# Patient Record
Sex: Male | Born: 1940 | Race: White | Hispanic: No | Marital: Single | Smoking: Current every day smoker
Health system: Southern US, Community
[De-identification: ages and names within clinical notes are randomized; demographics above are authoritative.]

## PROBLEM LIST (undated history)

## (undated) DIAGNOSIS — M1990 Unspecified osteoarthritis, unspecified site: Secondary | ICD-10-CM

## (undated) DIAGNOSIS — R0602 Shortness of breath: Secondary | ICD-10-CM

## (undated) DIAGNOSIS — J449 Chronic obstructive pulmonary disease, unspecified: Secondary | ICD-10-CM

## (undated) DIAGNOSIS — I2510 Atherosclerotic heart disease of native coronary artery without angina pectoris: Secondary | ICD-10-CM

## (undated) DIAGNOSIS — I10 Essential (primary) hypertension: Secondary | ICD-10-CM

## (undated) DIAGNOSIS — R079 Chest pain, unspecified: Secondary | ICD-10-CM

## (undated) HISTORY — PX: MANDIBLE SURGERY: SHX707

---

## 2007-07-28 ENCOUNTER — Inpatient Hospital Stay (HOSPITAL_COMMUNITY): Admission: EM | Admit: 2007-07-28 | Discharge: 2007-08-06 | Payer: Self-pay | Admitting: Emergency Medicine

## 2007-07-30 ENCOUNTER — Encounter (INDEPENDENT_AMBULATORY_CARE_PROVIDER_SITE_OTHER): Payer: Self-pay | Admitting: Internal Medicine

## 2007-08-02 ENCOUNTER — Encounter (INDEPENDENT_AMBULATORY_CARE_PROVIDER_SITE_OTHER): Payer: Self-pay | Admitting: Internal Medicine

## 2007-08-02 ENCOUNTER — Ambulatory Visit: Payer: Self-pay | Admitting: Vascular Surgery

## 2007-08-13 ENCOUNTER — Ambulatory Visit: Payer: Self-pay | Admitting: Internal Medicine

## 2007-11-02 ENCOUNTER — Ambulatory Visit: Payer: Self-pay | Admitting: Cardiovascular Disease

## 2008-02-07 ENCOUNTER — Emergency Department: Payer: Self-pay | Admitting: Internal Medicine

## 2008-02-15 ENCOUNTER — Ambulatory Visit: Payer: Self-pay | Admitting: Urology

## 2008-02-21 ENCOUNTER — Ambulatory Visit: Payer: Self-pay | Admitting: Urology

## 2008-03-06 ENCOUNTER — Ambulatory Visit: Payer: Self-pay | Admitting: Urology

## 2008-03-10 ENCOUNTER — Ambulatory Visit: Payer: Self-pay | Admitting: Urology

## 2008-03-14 ENCOUNTER — Ambulatory Visit: Payer: Self-pay | Admitting: Urology

## 2008-12-25 ENCOUNTER — Inpatient Hospital Stay (HOSPITAL_COMMUNITY): Admission: EM | Admit: 2008-12-25 | Discharge: 2008-12-27 | Payer: Self-pay | Admitting: Emergency Medicine

## 2009-01-14 ENCOUNTER — Inpatient Hospital Stay (HOSPITAL_COMMUNITY): Admission: EM | Admit: 2009-01-14 | Discharge: 2009-01-16 | Payer: Self-pay | Admitting: Internal Medicine

## 2009-02-07 ENCOUNTER — Ambulatory Visit: Payer: Self-pay | Admitting: Internal Medicine

## 2009-02-14 ENCOUNTER — Ambulatory Visit: Payer: Self-pay | Admitting: Internal Medicine

## 2009-02-14 ENCOUNTER — Encounter: Payer: Self-pay | Admitting: Family Medicine

## 2009-02-14 LAB — CONVERTED CEMR LAB
ALT: 9 units/L (ref 0–53)
Albumin: 4.4 g/dL (ref 3.5–5.2)
BUN: 12 mg/dL (ref 6–23)
Basophils Relative: 0 % (ref 0–1)
CO2: 26 meq/L (ref 19–32)
Chloride: 103 meq/L (ref 96–112)
Creatinine, Ser: 0.77 mg/dL (ref 0.40–1.50)
Eosinophils Relative: 1 % (ref 0–5)
HCT: 50.8 % (ref 39.0–52.0)
Hemoglobin: 17.2 g/dL — ABNORMAL HIGH (ref 13.0–17.0)
LDL Cholesterol: 90 mg/dL (ref 0–99)
Lymphocytes Relative: 17 % (ref 12–46)
Lymphs Abs: 2.3 10*3/uL (ref 0.7–4.0)
MCHC: 33.9 g/dL (ref 30.0–36.0)
MCV: 89.8 fL (ref 78.0–100.0)
Monocytes Absolute: 1.1 10*3/uL — ABNORMAL HIGH (ref 0.1–1.0)
Monocytes Relative: 9 % (ref 3–12)
Neutro Abs: 9.7 10*3/uL — ABNORMAL HIGH (ref 1.7–7.7)
Platelets: 189 10*3/uL (ref 150–400)
Potassium: 3.9 meq/L (ref 3.5–5.3)
RDW: 14 % (ref 11.5–15.5)
Total CHOL/HDL Ratio: 3.4
Total Protein: 6.8 g/dL (ref 6.0–8.3)
Triglycerides: 80 mg/dL (ref <150)
VLDL: 16 mg/dL (ref 0–40)
WBC: 13.4 10*3/uL — ABNORMAL HIGH (ref 4.0–10.5)

## 2009-02-21 ENCOUNTER — Ambulatory Visit: Payer: Self-pay | Admitting: Internal Medicine

## 2009-02-27 ENCOUNTER — Ambulatory Visit: Payer: Self-pay | Admitting: Internal Medicine

## 2010-04-24 IMAGING — CR DG CHEST 1V PORT
1 series · 1 of 1 positions shown · non-contrast
Comparison: 12/25/2008

CLINICAL DATA: Shortness of breath

PORTABLE CHEST - 1 VIEW

[AP]
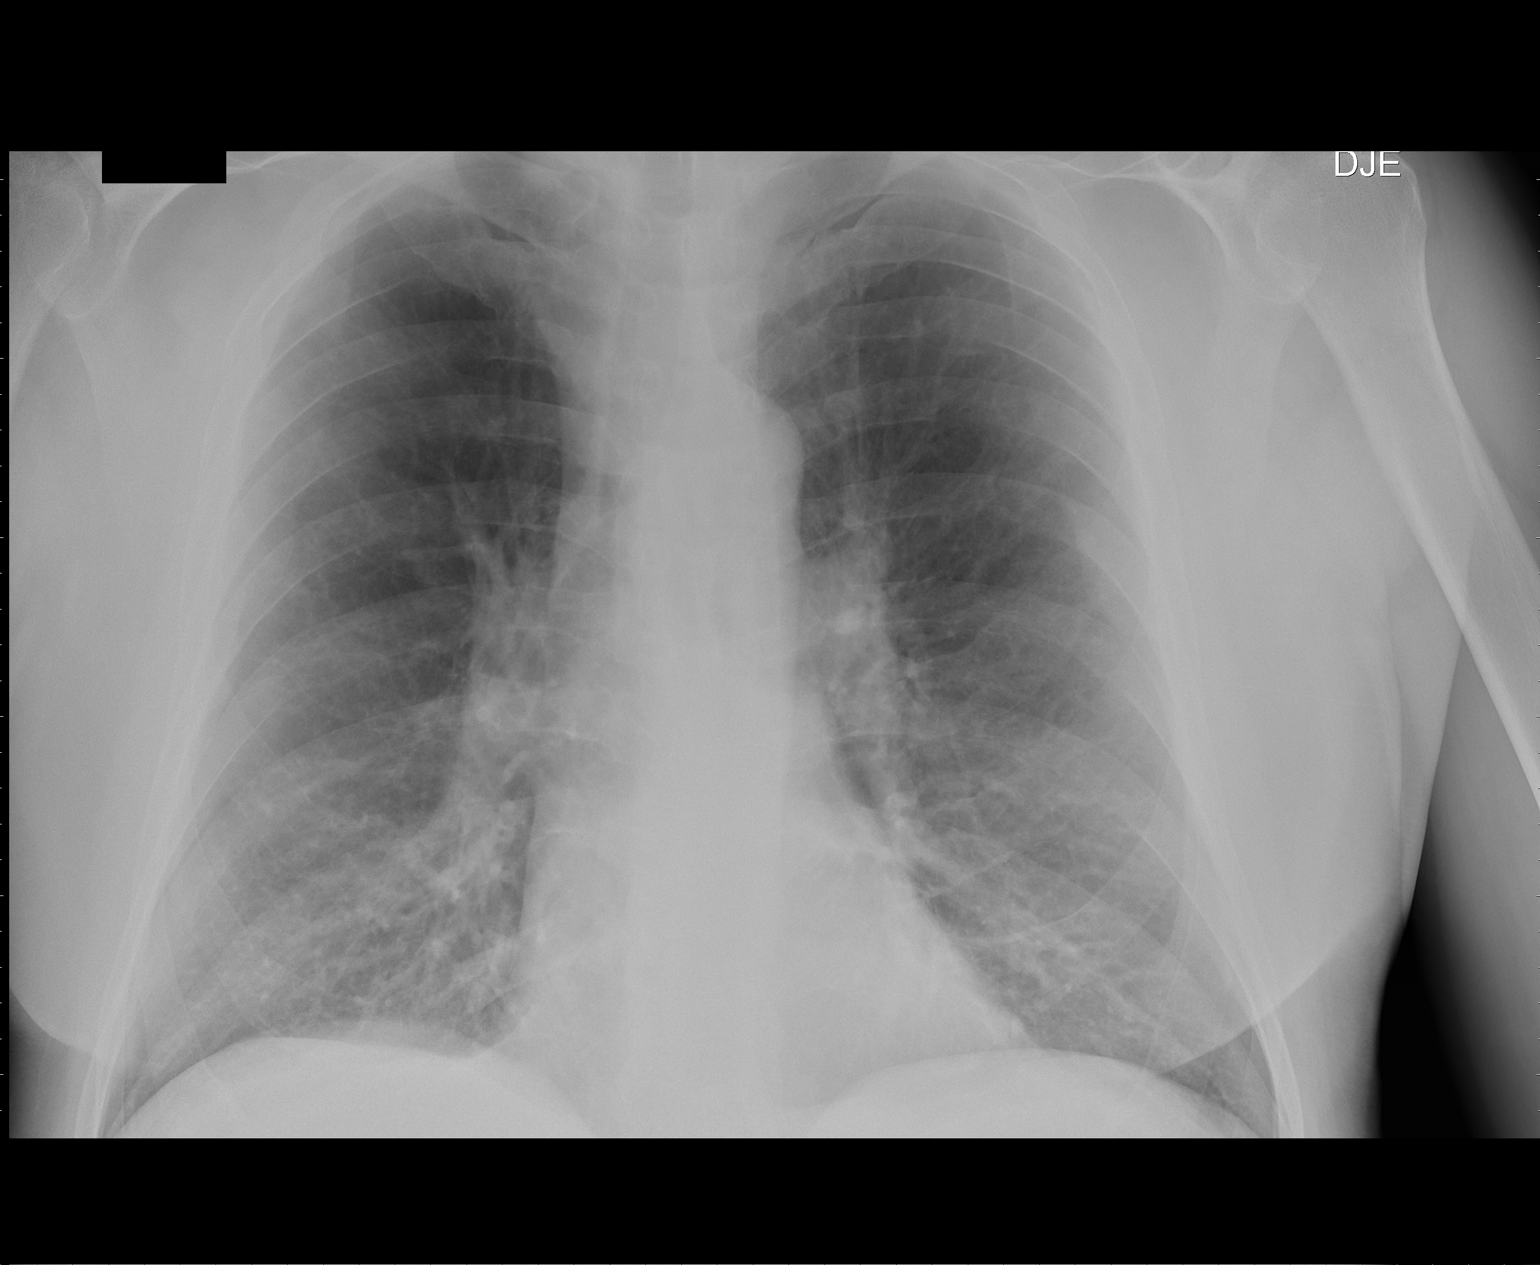

[1 of 1 positions shown; findings below may reference images not displayed]

FINDINGS: Heart size and mediastinal contours are unremarkable.

There is no pleural effusion or pulmonary interstitial edema.

No airspace consolidation identified.

There is interstitial change of COPD/emphysema.
IMPRESSION: 1.  No acute findings.
2.  COPD.

## 2010-07-18 LAB — GLUCOSE, CAPILLARY
Glucose-Capillary: 181 mg/dL — ABNORMAL HIGH (ref 70–99)
Glucose-Capillary: 198 mg/dL — ABNORMAL HIGH (ref 70–99)
Glucose-Capillary: 220 mg/dL — ABNORMAL HIGH (ref 70–99)
Glucose-Capillary: 268 mg/dL — ABNORMAL HIGH (ref 70–99)

## 2010-07-18 LAB — DIFFERENTIAL
Basophils Absolute: 0 10*3/uL (ref 0.0–0.1)
Basophils Relative: 0 % (ref 0–1)
Eosinophils Absolute: 0.3 10*3/uL (ref 0.0–0.7)
Lymphs Abs: 2.4 10*3/uL (ref 0.7–4.0)
Monocytes Absolute: 1 10*3/uL (ref 0.1–1.0)
Monocytes Relative: 8 % (ref 3–12)
Neutro Abs: 8.7 10*3/uL — ABNORMAL HIGH (ref 1.7–7.7)
Neutrophils Relative %: 70 % (ref 43–77)

## 2010-07-18 LAB — CBC
HCT: 45.5 % (ref 39.0–52.0)
HCT: 47.5 % (ref 39.0–52.0)
Hemoglobin: 14.9 g/dL (ref 13.0–17.0)
Hemoglobin: 15.7 g/dL (ref 13.0–17.0)
MCHC: 32.8 g/dL (ref 30.0–36.0)
MCHC: 33 g/dL (ref 30.0–36.0)
MCV: 90.1 fL (ref 78.0–100.0)
MCV: 90.9 fL (ref 78.0–100.0)
Platelets: 141 10*3/uL — ABNORMAL LOW (ref 150–400)
RBC: 4.98 MIL/uL (ref 4.22–5.81)
RBC: 5.23 MIL/uL (ref 4.22–5.81)
RDW: 14.4 % (ref 11.5–15.5)
WBC: 12.4 10*3/uL — ABNORMAL HIGH (ref 4.0–10.5)
WBC: 17.4 10*3/uL — ABNORMAL HIGH (ref 4.0–10.5)

## 2010-07-18 LAB — HEMOGLOBIN A1C: Mean Plasma Glucose: 140 mg/dL

## 2010-07-18 LAB — BASIC METABOLIC PANEL
Calcium: 9.3 mg/dL (ref 8.4–10.5)
Calcium: 9.6 mg/dL (ref 8.4–10.5)
Chloride: 107 mEq/L (ref 96–112)
Creatinine, Ser: 0.86 mg/dL (ref 0.4–1.5)
GFR calc non Af Amer: >60 mL/min (ref >60)
GFR calc non Af Amer: >60 mL/min (ref >60)
Glucose, Bld: 134 mg/dL — ABNORMAL HIGH (ref 70–99)
Glucose, Bld: 188 mg/dL — ABNORMAL HIGH (ref 70–99)
Potassium: 4.4 mEq/L (ref 3.5–5.1)

## 2010-07-18 LAB — POCT I-STAT 3, ART BLOOD GAS (G3+)
Acid-Base Excess: 1 mmol/L (ref 0.0–2.0)
Acid-Base Excess: 1 mmol/L (ref 0.0–2.0)
O2 Saturation: 89 %
TCO2: 27 mmol/L (ref 0–100)
TCO2: 28 mmol/L (ref 0–100)
pCO2 arterial: 41.9 mmHg (ref 35.0–45.0)

## 2010-07-18 LAB — COMPREHENSIVE METABOLIC PANEL
ALT: 10 U/L (ref 0–53)
CO2: 30 mEq/L (ref 19–32)
Calcium: 8.8 mg/dL (ref 8.4–10.5)
GFR calc non Af Amer: >60 mL/min (ref >60)
Sodium: 140 mEq/L (ref 135–145)

## 2010-07-19 LAB — CK TOTAL AND CKMB (NOT AT ARMC)
Relative Index: INVALID (ref 0.0–2.5)
Total CK: 52 U/L (ref 7–232)

## 2010-07-19 LAB — URINALYSIS, ROUTINE W REFLEX MICROSCOPIC
Bilirubin Urine: NEGATIVE
Glucose, UA: NEGATIVE mg/dL
Ketones, ur: NEGATIVE mg/dL
Protein, ur: NEGATIVE mg/dL

## 2010-07-19 LAB — CARDIAC PANEL(CRET KIN+CKTOT+MB+TROPI)
Total CK: 27 U/L (ref 7–232)
Total CK: 56 U/L (ref 7–232)

## 2010-07-19 LAB — GLUCOSE, CAPILLARY
Glucose-Capillary: 103 mg/dL — ABNORMAL HIGH (ref 70–99)
Glucose-Capillary: 145 mg/dL — ABNORMAL HIGH (ref 70–99)
Glucose-Capillary: 156 mg/dL — ABNORMAL HIGH (ref 70–99)
Glucose-Capillary: 182 mg/dL — ABNORMAL HIGH (ref 70–99)
Glucose-Capillary: 206 mg/dL — ABNORMAL HIGH (ref 70–99)

## 2010-07-19 LAB — POCT CARDIAC MARKERS
Myoglobin, poc: 68.4 ng/mL (ref 12–200)
Myoglobin, poc: 82.3 ng/mL (ref 12–200)
Troponin i, poc: <0.05 ng/mL (ref 0.00–0.09)

## 2010-07-19 LAB — HEMOGLOBIN A1C: Mean Plasma Glucose: 154 mg/dL

## 2010-07-19 LAB — DIFFERENTIAL
Basophils Absolute: 0 10*3/uL (ref 0.0–0.1)
Basophils Absolute: 0 10*3/uL (ref 0.0–0.1)
Basophils Relative: 0 % (ref 0–1)
Eosinophils Absolute: 0 10*3/uL (ref 0.0–0.7)
Eosinophils Relative: 0 % (ref 0–5)
Eosinophils Relative: 2 % (ref 0–5)
Lymphocytes Relative: 13 % (ref 12–46)
Lymphs Abs: 2.7 10*3/uL (ref 0.7–4.0)
Monocytes Absolute: 0.6 10*3/uL (ref 0.1–1.0)
Monocytes Absolute: 1.1 10*3/uL — ABNORMAL HIGH (ref 0.1–1.0)

## 2010-07-19 LAB — RAPID URINE DRUG SCREEN, HOSP PERFORMED
Amphetamines: NOT DETECTED
Benzodiazepines: NOT DETECTED
Tetrahydrocannabinol: NOT DETECTED

## 2010-07-19 LAB — COMPREHENSIVE METABOLIC PANEL
AST: 19 U/L (ref 0–37)
Albumin: 4 g/dL (ref 3.5–5.2)
Alkaline Phosphatase: 74 U/L (ref 39–117)
BUN: 13 mg/dL (ref 6–23)
Chloride: 106 mEq/L (ref 96–112)
GFR calc Af Amer: >60 mL/min (ref >60)
Potassium: 3.9 mEq/L (ref 3.5–5.1)
Total Bilirubin: 1.1 mg/dL (ref 0.3–1.2)

## 2010-07-19 LAB — CBC
HCT: 47.3 % (ref 39.0–52.0)
HCT: 51.1 % (ref 39.0–52.0)
Hemoglobin: 15.6 g/dL (ref 13.0–17.0)
MCV: 91.3 fL (ref 78.0–100.0)
Platelets: 156 10*3/uL (ref 150–400)
RBC: 5.71 MIL/uL (ref 4.22–5.81)
RDW: 14.4 % (ref 11.5–15.5)
WBC: 8.7 10*3/uL (ref 4.0–10.5)

## 2010-07-19 LAB — POCT I-STAT 3, ART BLOOD GAS (G3+)
O2 Saturation: 98 %
Patient temperature: 98.6
TCO2: 27 mmol/L (ref 0–100)

## 2010-07-19 LAB — POCT I-STAT, CHEM 8
Creatinine, Ser: 0.7 mg/dL (ref 0.4–1.5)
Glucose, Bld: 67 mg/dL — ABNORMAL LOW (ref 70–99)
Hemoglobin: 18.7 g/dL — ABNORMAL HIGH (ref 13.0–17.0)
TCO2: 26 mmol/L (ref 0–100)

## 2010-07-19 LAB — BASIC METABOLIC PANEL
BUN: 21 mg/dL (ref 6–23)
Chloride: 106 mEq/L (ref 96–112)
GFR calc non Af Amer: >60 mL/min (ref >60)
Glucose, Bld: 120 mg/dL — ABNORMAL HIGH (ref 70–99)
Potassium: 3.9 mEq/L (ref 3.5–5.1)
Sodium: 139 mEq/L (ref 135–145)

## 2010-07-19 LAB — LIPID PANEL
Cholesterol: 149 mg/dL (ref 0–200)
HDL: 54 mg/dL (ref >39)
LDL Cholesterol: 84 mg/dL (ref 0–99)
Triglycerides: 54 mg/dL (ref <150)

## 2010-07-19 LAB — BRAIN NATRIURETIC PEPTIDE: Pro B Natriuretic peptide (BNP): 39 pg/mL (ref 0.0–100.0)

## 2010-07-19 LAB — TSH: TSH: 0.723 u[IU]/mL (ref 0.350–4.500)

## 2010-08-27 NOTE — Discharge Summary (Signed)
NAME:  Jeremiah Burnett, Jeremiah Burnett NO.:  [id number]   MEDICAL RECORD NO.:  [id number]          PATIENT TYPE:  INP   LOCATION:  1411                         FACILITY[facility]  [facility]   PHYSICIAN:  Eduard Clos, MDDATE OF BIRTH:  Nov 09, 1940   DATE OF ADMISSION:  07/28/2007  DATE OF DISCHARGE:  08/05/2007                               DISCHARGE SUMMARY   ADDENDUM TO DISCHARGE SUMMARY:  Please see discharge summary dictated by  Dr. Sharon Seller for the hospital course and procedures done during this stay  on August 05, 2007.  This discharge summary is dictated for patient's  discharge medications as patient's condition has improved and is  asymptomatic and is being transferred to assisted living facility.   DISCHARGE MEDICATIONS:  1. Prednisone 30 mg daily. To be tapered by 10 mg daily for three      days.  2. Metformin 850 mg p.o. daily.  3. Albuterol MDI two puffs q.4 h p.r.n.  4. Atrovent MDI two puffs q.i.d. as needed.  5. Doxycycline 100 twice a day for one week.  6. Singulair 10 mg daily.  7. Lisinopril 10 mg p.o. daily.  8. Aspirin 81 mg daily.  9. Advair Diskus 250/50 one puff b.i.d.   PLAN:  The patient is being transferred to assisted living.  The patient  is to follow with the healthcare doctor in one week's time, actually on  May 1 to the __________.  The patient is to be on cardiac healthy, carb  modified diet, to check his blood sugars in the morning and evening.  Be  cautious about hypoglycemia, to recheck his basic metabolic panel and he  follows with his primary care physician in a week's time.  The patient  is to have a repeat CT chest in three months, which I did discuss with  the patient if the patient agreed to do so for further evaluation of his  scar in the right upper lung.  He was strongly advised to quit smoking.      Eduard Clos, MD  Electronically Signed     ANK/MEDQ  D:  08/05/2007  T:  08/05/2007  Job:  [id number]
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
## 2010-08-27 NOTE — Discharge Summary (Signed)
NAMEGUHAN, Jeremiah Burnett NO.:  [id number]   MEDICAL RECORD NO.:  [id number]          PATIENT TYPE:  INP   LOCATION:  1411                         FACILITY:  [facility]   PHYSICIAN:  Lonia Blood, M.D.DATE OF BIRTH:  [birth year]   DATE OF ADMISSION:  07/28/2007  DATE OF DISCHARGE:                               DISCHARGE SUMMARY   PRIMARY CARE PHYSICIAN:  [facility]   DISCHARGE DIAGNOSES:  1. Severe bronchiolitis - complete antibiotic course.  2. Acute chronic obstructive pulmonary disease exacerbation with      monitored severe baseline COPD.      a.     MDI therapy initiated.      b.     Singulair therapy initiated.      c.     Able to be weaned from O2 this hospitalization.  3. Right upper lobe lung scar - recommended CT scan follow up in three      months.  4. Newly diagnosed diabetes mellitus.      a.     Worsened by steroid taper.      b.     Medication therapy initiated during her hospital stay.      c.     Aspirin and ACE inhibitor therapy initiated during hospital       stay.  5. Bilateral lower extremity paresthesias.      a.     Normal B12 and folic acid.      b.     Normal ABIs.      c.     Felt to be diabetic neuropathy.  6. Tobacco abuse - discontinued during the hospital stay.   DISCHARGE MEDICATIONS:  Exact regimen to be determined at the time of  discharge.   FOLLOW UP:  The patient has been set up to follow up at [facility] on May 1, Friday at 11:15 in the morning with Dr. Donia Guiles.  At that time, the patient should be evaluated to assure that  his COPD is stable.  A three month follow up CT scan of the chest should  also be arranged to reevaluate the patient's right upper lobe scar.  Otherwise, the patient's b.i.d. CBG log should be assessed and the need  for further adjustment of his medication regimen should be considered.   CONSULTATIONS:  None.   PROCEDURES:  A CT scan of the chest on July 29, 2007:  Plain  film x-ray  suggestion of nodularity at the second right costovertebral junction  actually corresponds to degenerative change.  Central lobular emphysema  with ill-defined nodular opacities throughout.  Consistent with  infectious bronchiolitis.  Probable scarring in the right upper lobe  adjacent to pleural thickening.  Recommend a follow up CT scan of the  chest in three months.  Nonobstructive bilateral renal calculi.   HOSPITAL COURSE:  [name] is a very pleasant 70 year old male  who was admitted to the hospital on July 28, 2007, with complaints of  severe shortness of breath.  He also reported significant purulent  sputum for approximately a week.  The patient  was admitted to the acute  units.  Exam and x-ray suggested severe bronchiolitis with an acute  bronchospastic COPD exacerbation.  The patient required high-dose IV  Solu-Medrol as well as frequent nebulizer therapies.  Antibiotics were  administered.  Supplemental oxygen was required.  Fortunately, the  patient responded nicely to our therapy.  At the time of his discharge,  he is able to be weaned down to a minimal dose of steroids.  He is well  maintained on MDI therapy alone.  He is not requiring ongoing oxygen  therapy.  As noted above, a CT scan of the chest did reveal a right  upper lobe scar and recommendation is made to follow this up in three  months.  The patient was able to be liberated from oxygen and remained  stable from a respiratory standpoint at his discharge.  He was counseled  as to the connection between his acute illness and tobacco abuse and  advised to discontinue tobacco abuse.  He was able to successfully avoid  cravings of smoking during his hospital stay, and this was further  supported with a nicotine patch.   During the patient's hospital stay, significant hyperglycemia was noted.  Hemoglobin A1c was obtained and, in fact, was found to be elevated to  6.4.  For this reason, it was  felt that this was an actual diagnosis of  diabetes and not simply related to the patient's Solu-Medrol.  The  diabetic education and initiation of oral diabetes medicines was carried  out during his hospital stay.  Close monitoring of CBG in the outpatient  setting will be indicated and further adjustment of diabetic medications  will likely be necessary.  At the time of his discharge, however, his  CBGs do remain quite stable.   The patient also complained of bilateral lower extremity paresthesias  during his hospital stay.  Dorsalis pedis pulses were very weak on  manual palpation.  ABIs were accomplished and were, in fact, found to be  normal.  B12 and folic acid levels were obtained and were, in fact,  normal.  It is felt that this likely represents a mild early diabetic  neuropathy.  With good control of the patient's blood sugars, these  symptoms improved.  This shall simply need to be followed in the  outpatient setting.      Lonia Blood, M.D.  Electronically Signed     JTM/MEDQ  D:  08/04/2007  T:  08/04/2007  Job:  [id number]   cc:   Melvern Banker  Fax: [phone]

## 2011-01-07 LAB — CULTURE, RESPIRATORY W GRAM STAIN: Culture: NORMAL

## 2011-01-07 LAB — APTT
aPTT: 25
aPTT: 34

## 2011-01-07 LAB — DIFFERENTIAL
Basophils Absolute: 0
Basophils Relative: 0
Eosinophils Absolute: 0
Eosinophils Relative: 0
Lymphocytes Relative: 9 — ABNORMAL LOW
Lymphs Abs: 1.5
Monocytes Absolute: 1.4 — ABNORMAL HIGH
Monocytes Relative: 9
Neutro Abs: 13.4 — ABNORMAL HIGH
Neutrophils Relative %: 82 — ABNORMAL HIGH

## 2011-01-07 LAB — B-NATRIURETIC PEPTIDE (CONVERTED LAB): Pro B Natriuretic peptide (BNP): <30

## 2011-01-07 LAB — BASIC METABOLIC PANEL WITH GFR
BUN: 21
CO2: 25
Calcium: 9.3
Chloride: 102
Creatinine, Ser: 1
GFR calc non Af Amer: >60
Glucose, Bld: 341 — ABNORMAL HIGH
Potassium: 3.6
Sodium: 136

## 2011-01-07 LAB — CBC
HCT: 43.9
HCT: 49.4
Hemoglobin: 14.9
Hemoglobin: 16.7
MCHC: 33.9
MCV: 88.8
Platelets: 195
RBC: 4.94
RBC: 5.38
RBC: 5.58
RDW: 14.3
WBC: 16.4 — ABNORMAL HIGH
WBC: 20.2 — ABNORMAL HIGH
WBC: 23 — ABNORMAL HIGH

## 2011-01-07 LAB — BASIC METABOLIC PANEL
Calcium: 9.1
Chloride: 103
Creatinine, Ser: 0.86
GFR calc Af Amer: >60
GFR calc non Af Amer: >60
Potassium: 3.6
Sodium: 139

## 2011-01-07 LAB — VITAMIN B12: Vitamin B-12: 466 (ref 211–911)

## 2011-01-07 LAB — MAGNESIUM: Magnesium: 1.9

## 2011-01-07 LAB — EXPECTORATED SPUTUM ASSESSMENT W GRAM STAIN, RFLX TO RESP C

## 2011-01-07 LAB — HEMOGLOBIN A1C
Hgb A1c MFr Bld: 6.4 — ABNORMAL HIGH
Mean Plasma Glucose: 151

## 2011-01-07 LAB — FOLATE: Folate: 8.6

## 2011-01-07 LAB — CK TOTAL AND CKMB (NOT AT ARMC)
CK, MB: 0.9
Relative Index: INVALID
Total CK: 31

## 2011-01-07 LAB — TROPONIN I: Troponin I: 0.03

## 2011-01-07 LAB — TSH: TSH: 0.433

## 2011-01-07 LAB — PHOSPHORUS: Phosphorus: 2.5

## 2011-08-31 ENCOUNTER — Inpatient Hospital Stay (HOSPITAL_COMMUNITY)
Admission: EM | Admit: 2011-08-31 | Discharge: 2011-09-08 | DRG: 286 | Disposition: A | Discharge status: Home / Self Care | Payer: Medicare Other | Source: Ambulatory Visit | Attending: Cardiology | Admitting: Cardiology

## 2011-08-31 ENCOUNTER — Encounter (HOSPITAL_COMMUNITY): Payer: Self-pay | Admitting: *Deleted

## 2011-08-31 ENCOUNTER — Emergency Department (HOSPITAL_COMMUNITY): Payer: Medicare Other

## 2011-08-31 DIAGNOSIS — E1169 Type 2 diabetes mellitus with other specified complication: Secondary | ICD-10-CM | POA: Diagnosis present

## 2011-08-31 DIAGNOSIS — R0600 Dyspnea, unspecified: Secondary | ICD-10-CM

## 2011-08-31 DIAGNOSIS — J9600 Acute respiratory failure, unspecified whether with hypoxia or hypercapnia: Secondary | ICD-10-CM | POA: Diagnosis present

## 2011-08-31 DIAGNOSIS — I2510 Atherosclerotic heart disease of native coronary artery without angina pectoris: Principal | ICD-10-CM | POA: Diagnosis present

## 2011-08-31 DIAGNOSIS — I249 Acute ischemic heart disease, unspecified: Secondary | ICD-10-CM

## 2011-08-31 DIAGNOSIS — E7800 Pure hypercholesterolemia, unspecified: Secondary | ICD-10-CM | POA: Diagnosis present

## 2011-08-31 DIAGNOSIS — Z87891 Personal history of nicotine dependence: Secondary | ICD-10-CM

## 2011-08-31 DIAGNOSIS — I10 Essential (primary) hypertension: Secondary | ICD-10-CM | POA: Diagnosis present

## 2011-08-31 DIAGNOSIS — K219 Gastro-esophageal reflux disease without esophagitis: Secondary | ICD-10-CM | POA: Diagnosis present

## 2011-08-31 DIAGNOSIS — J441 Chronic obstructive pulmonary disease with (acute) exacerbation: Secondary | ICD-10-CM | POA: Diagnosis present

## 2011-08-31 HISTORY — DX: Essential (primary) hypertension: I10

## 2011-08-31 HISTORY — DX: Chronic obstructive pulmonary disease, unspecified: J44.9

## 2011-08-31 LAB — COMPREHENSIVE METABOLIC PANEL
ALT: 11 U/L (ref 0–53)
ALT: 11 U/L (ref 0–53)
AST: 11 U/L (ref 0–37)
AST: 12 U/L (ref 0–37)
Alkaline Phosphatase: 76 U/L (ref 39–117)
Alkaline Phosphatase: 72 U/L (ref 39–117)
CO2: 27 mEq/L (ref 19–32)
Calcium: 9.3 mg/dL (ref 8.4–10.5)
GFR calc Af Amer: 83 mL/min — ABNORMAL LOW (ref >90)
Glucose, Bld: 119 mg/dL — ABNORMAL HIGH (ref 70–99)
Potassium: 3.4 mEq/L — ABNORMAL LOW (ref 3.5–5.1)
Potassium: 3.4 mEq/L — ABNORMAL LOW (ref 3.5–5.1)
Sodium: 139 mEq/L (ref 135–145)
Sodium: 141 mEq/L (ref 135–145)
Total Protein: 6.2 g/dL (ref 6.0–8.3)
Total Protein: 6.2 g/dL (ref 6.0–8.3)

## 2011-08-31 LAB — CBC
HCT: 46.1 % (ref 39.0–52.0)
Hemoglobin: 16.6 g/dL (ref 13.0–17.0)
Hemoglobin: 15.7 g/dL (ref 13.0–17.0)
MCHC: 35 g/dL (ref 30.0–36.0)
MCHC: 34.1 g/dL (ref 30.0–36.0)
Platelets: 142 10*3/uL — ABNORMAL LOW (ref 150–400)
RBC: 5.45 MIL/uL (ref 4.22–5.81)
WBC: 10 10*3/uL (ref 4.0–10.5)

## 2011-08-31 LAB — TROPONIN I: Troponin I: <0.3 ng/mL (ref <0.30)

## 2011-08-31 LAB — DIFFERENTIAL
Basophils Absolute: 0 10*3/uL (ref 0.0–0.1)
Basophils Relative: 0 % (ref 0–1)
Lymphocytes Relative: 24 % (ref 12–46)
Monocytes Relative: 8 % (ref 3–12)
Neutro Abs: 6.7 10*3/uL (ref 1.7–7.7)
Neutrophils Relative %: 66 % (ref 43–77)

## 2011-08-31 LAB — CARDIAC PANEL(CRET KIN+CKTOT+MB+TROPI)
CK, MB: 2.2 ng/mL (ref 0.3–4.0)
Relative Index: INVALID (ref 0.0–2.5)
Troponin I: <0.3 ng/mL (ref <0.30)

## 2011-08-31 LAB — GLUCOSE, CAPILLARY: Glucose-Capillary: 66 mg/dL — ABNORMAL LOW (ref 70–99)

## 2011-08-31 LAB — PROTIME-INR
INR: 1.04 (ref 0.00–1.49)
Prothrombin Time: 13.8 seconds (ref 11.6–15.2)

## 2011-08-31 MED ORDER — ONDANSETRON HCL 4 MG/2ML IJ SOLN: 4.0000 mg | Freq: Four times a day (QID) | INTRAMUSCULAR | Status: DC | PRN

## 2011-08-31 MED ORDER — GI COCKTAIL ~~LOC~~
30.0000 mL | Freq: Once | ORAL | Status: AC
  Administered 2011-08-31: 30 mL via ORAL
  Filled 2011-08-31: qty 30

## 2011-08-31 MED ORDER — HYDROMORPHONE HCL PF 1 MG/ML IJ SOLN
1.0000 mg | Freq: Once | INTRAMUSCULAR | Status: AC
  Administered 2011-08-31: 1 mg via INTRAVENOUS
  Filled 2011-08-31: qty 1

## 2011-08-31 MED ORDER — ASPIRIN EC 81 MG PO TBEC
81.0000 mg | DELAYED_RELEASE_TABLET | Freq: Every day | ORAL | Status: DC
  Administered 2011-09-01 – 2011-09-08 (×8): 81 mg via ORAL
  Filled 2011-08-31 (×8): qty 1

## 2011-08-31 MED ORDER — ASPIRIN EC 81 MG PO TBEC: 81.0000 mg | DELAYED_RELEASE_TABLET | Freq: Every day | ORAL | Status: DC

## 2011-08-31 MED ORDER — NITROGLYCERIN 2 % TD OINT: 1.0000 [in_us] | TOPICAL_OINTMENT | Freq: Once | TRANSDERMAL | Status: DC

## 2011-08-31 MED ORDER — METOPROLOL TARTRATE 12.5 MG HALF TABLET
12.5000 mg | ORAL_TABLET | Freq: Two times a day (BID) | ORAL | Status: DC
  Administered 2011-08-31 – 2011-09-04 (×8): 12.5 mg via ORAL
  Filled 2011-08-31 (×9): qty 1

## 2011-08-31 MED ORDER — SODIUM CHLORIDE 0.9 % IV BOLUS (SEPSIS)
500.0000 mL | Freq: Once | INTRAVENOUS | Status: AC
  Administered 2011-08-31: 500 mL via INTRAVENOUS

## 2011-08-31 MED ORDER — NITROGLYCERIN 2 % TD OINT
0.5000 [in_us] | TOPICAL_OINTMENT | Freq: Four times a day (QID) | TRANSDERMAL | Status: DC
  Administered 2011-08-31 – 2011-09-02 (×6): 0.5 [in_us] via TOPICAL
  Filled 2011-08-31: qty 30

## 2011-08-31 MED ORDER — MORPHINE SULFATE 2 MG/ML IJ SOLN
2.0000 mg | Freq: Once | INTRAMUSCULAR | Status: AC
  Administered 2011-08-31: 2 mg via INTRAVENOUS
  Filled 2011-08-31: qty 1

## 2011-08-31 MED ORDER — NITROGLYCERIN 0.4 MG SL SUBL: 0.4000 mg | SUBLINGUAL_TABLET | SUBLINGUAL | Status: DC | PRN

## 2011-08-31 MED ORDER — FAMOTIDINE 20 MG PO TABS
20.0000 mg | ORAL_TABLET | Freq: Two times a day (BID) | ORAL | Status: DC
  Administered 2011-08-31 – 2011-09-08 (×15): 20 mg via ORAL
  Filled 2011-08-31 (×19): qty 1

## 2011-08-31 MED ORDER — ATORVASTATIN CALCIUM 80 MG PO TABS
80.0000 mg | ORAL_TABLET | Freq: Every day | ORAL | Status: DC
  Administered 2011-09-01 – 2011-09-07 (×7): 80 mg via ORAL
  Filled 2011-08-31 (×10): qty 1

## 2011-08-31 MED ORDER — INSULIN ASPART 100 UNIT/ML ~~LOC~~ SOLN
0.0000 [IU] | Freq: Three times a day (TID) | SUBCUTANEOUS | Status: DC
  Administered 2011-09-02: 8 [IU] via SUBCUTANEOUS
  Administered 2011-09-03: 3 [IU] via SUBCUTANEOUS
  Administered 2011-09-03 – 2011-09-04 (×3): 5 [IU] via SUBCUTANEOUS
  Administered 2011-09-04: 8 [IU] via SUBCUTANEOUS
  Administered 2011-09-04: 5 [IU] via SUBCUTANEOUS
  Administered 2011-09-05 (×2): 3 [IU] via SUBCUTANEOUS
  Administered 2011-09-06: 2 [IU] via SUBCUTANEOUS
  Administered 2011-09-06: 3 [IU] via SUBCUTANEOUS
  Administered 2011-09-07: 8 [IU] via SUBCUTANEOUS
  Administered 2011-09-07: 3 [IU] via SUBCUTANEOUS
  Administered 2011-09-08: 2 [IU] via SUBCUTANEOUS

## 2011-08-31 MED ORDER — LEVALBUTEROL HCL 1.25 MG/0.5ML IN NEBU
1.2500 mg | INHALATION_SOLUTION | Freq: Three times a day (TID) | RESPIRATORY_TRACT | Status: DC
  Administered 2011-08-31 – 2011-09-08 (×23): 1.25 mg via RESPIRATORY_TRACT
  Filled 2011-08-31 (×28): qty 0.5

## 2011-08-31 MED ORDER — CLOPIDOGREL BISULFATE 75 MG PO TABS
300.0000 mg | ORAL_TABLET | Freq: Once | ORAL | Status: AC
  Administered 2011-08-31: 300 mg via ORAL
  Filled 2011-08-31: qty 1

## 2011-08-31 MED ORDER — CLOPIDOGREL BISULFATE 75 MG PO TABS
75.0000 mg | ORAL_TABLET | Freq: Every day | ORAL | Status: DC
Start: 2011-09-01 — End: 2011-09-08
  Administered 2011-09-01 – 2011-09-08 (×8): 75 mg via ORAL
  Filled 2011-08-31 (×8): qty 1

## 2011-08-31 MED ORDER — ASPIRIN 325 MG PO TABS: 325.0000 mg | ORAL_TABLET | Freq: Once | ORAL | Status: DC

## 2011-08-31 MED ORDER — TIOTROPIUM BROMIDE MONOHYDRATE 18 MCG IN CAPS
18.0000 ug | ORAL_CAPSULE | Freq: Every day | RESPIRATORY_TRACT | Status: DC
  Administered 2011-09-01 – 2011-09-08 (×7): 18 ug via RESPIRATORY_TRACT
  Filled 2011-08-31 (×3): qty 5

## 2011-08-31 MED ORDER — ALPRAZOLAM 0.25 MG PO TABS: 0.2500 mg | ORAL_TABLET | Freq: Two times a day (BID) | ORAL | Status: DC | PRN

## 2011-08-31 MED ORDER — ACETAMINOPHEN 325 MG PO TABS: 650.0000 mg | ORAL_TABLET | ORAL | Status: DC | PRN

## 2011-08-31 MED ORDER — LEVALBUTEROL HCL 1.25 MG/0.5ML IN NEBU
1.2500 mg | INHALATION_SOLUTION | RESPIRATORY_TRACT | Status: DC | PRN
  Administered 2011-09-01: 1.25 mg via RESPIRATORY_TRACT
  Filled 2011-08-31 (×2): qty 0.5

## 2011-08-31 MED ORDER — ASPIRIN 300 MG RE SUPP
300.0000 mg | RECTAL | Status: AC
  Filled 2011-08-31: qty 1

## 2011-08-31 MED ORDER — ASPIRIN 81 MG PO CHEW
324.0000 mg | CHEWABLE_TABLET | ORAL | Status: AC
  Filled 2011-08-31: qty 4
  Filled 2011-08-31: qty 1

## 2011-08-31 MED ORDER — HEPARIN BOLUS VIA INFUSION
4000.0000 [IU] | Freq: Once | INTRAVENOUS | Status: AC
Start: 2011-08-31 — End: 2011-08-31
  Administered 2011-08-31: 4000 [IU] via INTRAVENOUS
  Filled 2011-08-31: qty 4000

## 2011-08-31 MED ORDER — SODIUM CHLORIDE 0.9 % IV SOLN
INTRAVENOUS | Status: DC
  Administered 2011-08-31 – 2011-09-02 (×3): via INTRAVENOUS

## 2011-08-31 MED ORDER — FLUTICASONE-SALMETEROL 250-50 MCG/DOSE IN AEPB
1.0000 | INHALATION_SPRAY | Freq: Two times a day (BID) | RESPIRATORY_TRACT | Status: DC
  Administered 2011-08-31 – 2011-09-08 (×16): 1 via RESPIRATORY_TRACT
  Filled 2011-08-31 (×3): qty 14

## 2011-08-31 MED ORDER — HEPARIN (PORCINE) IN NACL 100-0.45 UNIT/ML-% IJ SOLN
1000.0000 [IU]/h | INTRAMUSCULAR | Status: DC
  Administered 2011-08-31: 1000 [IU]/h via INTRAVENOUS
  Filled 2011-08-31 (×2): qty 250

## 2011-08-31 NOTE — ED Provider Notes (Signed)
Complains of anterior chest pain/epigastric pain, nonradiating onset yesterday. Intermittent lasting approximately 45 minutes at a time initially worsened with exertion improved with rest accompanied by shortness of breath now has intermittent symptoms at rest. EMS treated patient with aspirin and 2 sublingual nitroglycerin without relief. On exam patient chronically ill-appearing lungs tachypnea clear to auscultation abdomen nontender heart regular rhythm no murmurs extremities without edema skin warm dry  medical decision making:Angina definite possibility given patient's risk factors i.e.age. Former smoker diabetes hypercholesterolemia  Doug Sou, MD 09/01/11 [phone]

## 2011-08-31 NOTE — H&P (Signed)
Jeremiah Burnett is an 71 y.o. male.   Chief Complaint: Chest pain/shortness of breath HPI: Patient is 71 year old male with past medical history significant for hypertension non-insulin-dependent diabetes mellitus, COPD, hypercholesteremia, history of tobacco abuse, history of alcohol abuse, positive family history of coronary artery disease, came to the ER complaining of recurrent retrosternal chest pain described as burning/heaviness radiating across the chest associated with shortness of breath and dizziness while [place] the highway [place] off and on since this morning patient received 4 baby aspirin to sublingual nitroglycerin with partial relief of chest pain in ER and was noted to be in sinus tachycardia with hypotension requiring fluid challenge and EKG in the ER showed sinus tach with no acute ischemic changes. Patient states he has been walking since yesterday from [facility] and developed exertional chest pain relieved initially with rest but again had recurrent chest pain at rest as he continued to work. Patient denies any nausea or vomiting diaphoresis denies palpitation lightheadedness or syncope but felt dizzy. Denies any relation of chest pain to food breathing or movement denies any cardiac workup in recent past.  Past Medical History  Diagnosis Date  . Diabetes mellitus   . Hypertension     History reviewed. No pertinent past surgical history.  No family history on file. Social History:  does not have a smoking history on file. He does not have any smokeless tobacco history on file. His alcohol and drug histories not on file.  Allergies: No Known Allergies   (Not in a hospital admission)  Results for orders placed during the hospital encounter of 08/31/11 (from the past 48 hour(s))  TROPONIN I     Status: Normal   Collection Time   08/31/11  3:20 PM      Component Value Range Comment   Troponin I <0.30  <0.30 (ng/mL)   CBC     Status: Abnormal   Collection Time   08/31/11  3:20 PM      Component Value Range Comment   WBC 12.2 (*) 4.0 - 10.5 (K/uL)    RBC 5.45  4.22 - 5.81 (MIL/uL)    Hemoglobin 16.6  13.0 - 17.0 (g/dL)    HCT 08.6  57.8 - 46.9 (%)    MCV 87.0  78.0 - 100.0 (fL)    MCH 30.5  26.0 - 34.0 (pg)    MCHC 35.0  30.0 - 36.0 (g/dL)    RDW 62.9  52.8 - 41.3 (%)    Platelets 142 (*) 150 - 400 (K/uL)   COMPREHENSIVE METABOLIC PANEL     Status: Abnormal   Collection Time   08/31/11  3:20 PM      Component Value Range Comment   Sodium 139  135 - 145 (mEq/L)    Potassium 3.4 (*) 3.5 - 5.1 (mEq/L)    Chloride 103  96 - 112 (mEq/L)    CO2 21  19 - 32 (mEq/L)    Glucose, Bld 119 (*) 70 - 99 (mg/dL)    BUN 15  6 - 23 (mg/dL)    Creatinine, Ser 2.44  0.50 - 1.35 (mg/dL)    Calcium 9.5  8.4 - 10.5 (mg/dL)    Total Protein 6.2  6.0 - 8.3 (g/dL)    Albumin 3.4 (*) 3.5 - 5.2 (g/dL)    AST 11  0 - 37 (U/L)    ALT 11  0 - 53 (U/L)    Alkaline Phosphatase 76  39 - 117 (U/L)    Total  Bilirubin 2.0 (*) 0.3 - 1.2 (mg/dL)    GFR calc non Af Amer 71 (*) >90 (mL/min)    GFR calc Af Amer 83 (*) >90 (mL/min)   LIPASE, BLOOD     Status: Normal   Collection Time   08/31/11  3:20 PM      Component Value Range Comment   Lipase 29  11 - 59 (U/L)    Dg Chest Portable 1 View  08/31/2011  *RADIOLOGY REPORT*  Clinical Data: Chest pain and shortness of breath.  PORTABLE CHEST - 1 VIEW  Comparison: Chest x-ray 01/14/2009.  Findings: Lung volumes are normal.  Linear bibasilar opacities are favored to represent areas of subsegmental atelectasis or scarring (similar to prior).  No consolidative airspace disease.  No pleural effusions.  Pulmonary vasculature and the cardiomediastinal silhouette are within normal limits.  IMPRESSION: 1.  Minimal bibasilar subsegmental atelectasis and/or scarring.  No radiographic evidence of acute cardiopulmonary disease.  Original Report Authenticated By: Florencia Reasons, M.D.    Review of Systems  Constitutional: Negative for  fever, chills and weight loss.  Eyes: Negative for blurred vision and double vision.  Respiratory: Positive for cough, shortness of breath and wheezing. Negative for hemoptysis and sputum production.   Cardiovascular: Positive for chest pain. Negative for palpitations, orthopnea, claudication and leg swelling.  Gastrointestinal: Negative for heartburn, nausea and abdominal pain.  Neurological: Positive for dizziness. Negative for headaches.    Blood pressure 111/64, pulse 73, temperature 98.7 F (37.1 C), temperature source Oral, resp. rate 19, SpO2 95.00%. Physical Exam  Constitutional: He is oriented to person, place, and time.  HENT:  Head: Normocephalic and atraumatic.  Nose: Nose normal.  Mouth/Throat: No oropharyngeal exudate.  Eyes: Conjunctivae and EOM are normal. Pupils are equal, round, and reactive to light. Left eye exhibits no discharge. No scleral icterus.  Neck: Normal range of motion. Neck supple. No JVD present. No tracheal deviation present. No thyromegaly present.  Cardiovascular: Normal rate and regular rhythm.  Exam reveals no friction rub.   Murmur heard.      Soft systolic murmur and S3 gallop noted  Respiratory:       Decrease breath sound at bases with bilateral expiratory wheezing  GI: Soft. He exhibits no distension. There is no tenderness. There is no rebound.  Musculoskeletal: He exhibits no edema and no tenderness.  Lymphadenopathy:    He has no cervical adenopathy.  Neurological: He is alert and oriented to person, place, and time.     Assessment/Plan Acute coronary syndrome rule out MI Exacerbation of COPD Hypertension Non-insulin-dependent diabetes mellitus History of tobacco abuse History of alcohol abuse Positive family history of coronary artery disease Plan As per orders  Dunbar Buras N 08/31/2011, 6:19 PM

## 2011-08-31 NOTE — Progress Notes (Signed)
ANTICOAGULATION CONSULT NOTE - Initial Consult  Pharmacy Consult for Heparin Indication: chest pain/ACS  No Known Allergies  Patient Measurements: Height: 5\' 8"  (172.7 cm) Weight: 178 lb 12.7 oz (81.1 kg) IBW/kg (Calculated) : 68.4  Heparin Dosing Weight: 81.1 kg  Vital Signs: Temp: 97.5 F (36.4 C) (05/19 2041) Temp src: Oral (05/19 1931) BP: 114/76 mmHg (05/19 2041) Pulse Rate: 70  (05/19 2041)  Labs:  Basename 08/31/11 1520  HGB 16.6  HCT 47.4  PLT 142*  APTT --  LABPROT --  INR --  HEPARINUNFRC --  CREATININE 1.02  CKTOTAL --  CKMB --  TROPONINI <0.30    Estimated Creatinine Clearance: 63.3 ml/min (by C-G formula based on Cr of 1.02).   Medical History: Past Medical History  Diagnosis Date  . Diabetes mellitus   . Hypertension     Medications:  Prescriptions prior to admission  Medication Sig Dispense Refill  . aspirin EC 81 MG tablet Take 81 mg by mouth daily.      Marland Kitchen glimepiride (AMARYL) 2 MG tablet Take 2 mg by mouth daily before breakfast.      . losartan (COZAAR) 100 MG tablet Take 100 mg by mouth daily.        Assessment: 71yom to start heparin for CP/ACS. Patient is not taking anticoagulants and no significant bleeding has been reported. - Baseline INR pending - H/H wnl, Plts 142 - Heparin dosing weight: 81.1kg - CrCl 53ml/min  Goal of Therapy:  Heparin level 0.3-0.7 units/ml Monitor platelets by anticoagulation protocol: Yes   Plan:  1. Heparin IV bolus 4000 units x 1 2. Heparin drip 1000 units/hr (10 ml/hr) 3. Check heparin level 8hrs after heparin initiation 4. Daily heparin level and CBC  Cleon Dew [phone] 08/31/2011,8:44 PM

## 2011-08-31 NOTE — ED Provider Notes (Signed)
History     CSN: [id number]  Arrival date & time 08/31/11  1508   First MD Initiated Contact with Patient 08/31/11 1516      Chief Complaint  Patient presents with  . Chest Pain    (Consider location/radiation/quality/duration/timing/severity/associated sxs/prior treatment) HPI Comments: [place] the highway this am and developed epigastric/L chest burning.  Pain has been waxing and waning throughout the day today.  Says he has never had such severe pain in his chest.  Associated with dyspnea.  Worse with walking but also coming on at rest.  No other symptoms.   Patient is a 71 y.o. male presenting with chest pain. The history is provided by the patient.  Chest Pain The chest pain began 6 - 12 hours ago. Duration of episode(s) is 8 hours. Chest pain occurs intermittently. The chest pain is unchanged. The pain is associated with exertion (not worse with breathing). The severity of the pain is moderate. The quality of the pain is described as burning. Radiates to: from central lower chest across left lower chest. Primary symptoms include shortness of breath. Pertinent negatives for primary symptoms include no fever, no fatigue, no syncope, no cough, no wheezing, no palpitations, no abdominal pain, no nausea, no vomiting and no altered mental status. He tried nitroglycerin for the symptoms.     Past Medical History  Diagnosis Date  . Diabetes mellitus   . Hypertension     History reviewed. No pertinent past surgical history.  No family history on file.  History  Substance Use Topics  . Smoking status: Not on file  . Smokeless tobacco: Not on file  . Alcohol Use:       Review of Systems  Constitutional: Negative for fever, activity change and fatigue.  HENT: Negative for congestion.   Eyes: Negative for pain.  Respiratory: Positive for shortness of breath. Negative for cough, chest tightness, wheezing and stridor.   Cardiovascular: Positive for chest pain. Negative  for palpitations, leg swelling and syncope.  Gastrointestinal: Negative for nausea, vomiting and abdominal pain.  Genitourinary: Negative for dysuria.  Musculoskeletal: Negative for arthralgias.  Skin: Negative for rash.  Neurological: Negative for headaches.  Psychiatric/Behavioral: Negative for behavioral problems and altered mental status.    Allergies  Review of patient's allergies indicates no known allergies.  Home Medications   Current Outpatient Rx  Name Route Sig Dispense Refill  . ASPIRIN EC 81 MG PO TBEC Oral Take 81 mg by mouth daily.    Marland Kitchen GLIMEPIRIDE 2 MG PO TABS Oral Take 2 mg by mouth daily before breakfast.    . LOSARTAN POTASSIUM 100 MG PO TABS Oral Take 100 mg by mouth daily.      BP 96/60  Pulse 100  Temp(Src) 98.7 F (37.1 C) (Oral)  Resp 18  SpO2 100%  Physical Exam  Constitutional: He is oriented to person, place, and time. He appears well-developed and well-nourished. No distress.  HENT:  Head: Normocephalic and atraumatic.  Eyes: Conjunctivae and EOM are normal. Pupils are equal, round, and reactive to light. No scleral icterus.  Neck: Normal range of motion. Neck supple.  Cardiovascular: Normal rate and regular rhythm.  Exam reveals no gallop and no friction rub.   No murmur heard. Pulmonary/Chest: Effort normal. No respiratory distress. He has wheezes (bilateral moderate). He has no rales. He exhibits no tenderness.  Abdominal: Soft. He exhibits no distension and no mass. There is no tenderness. There is no rebound and no guarding.  Musculoskeletal:  Normal range of motion. He exhibits no edema and no tenderness.  Neurological: He is alert and oriented to person, place, and time. He has normal reflexes. No cranial nerve deficit. He exhibits normal muscle tone. Coordination normal.  Skin: Skin is warm and dry. No rash noted. He is not diaphoretic. No erythema.  Psychiatric: He has a normal mood and affect. His behavior is normal. Judgment and thought  content normal.    ED Course  Procedures (including critical care time)   Date: 08/31/2011  Rate: 115  Rhythm: normal sinus rhythm  QRS Axis: normal  Intervals: normal  ST/T Wave abnormalities: normal  Conduction Disutrbances:none  Narrative Interpretation:   Old EKG Reviewed: none available     Labs Reviewed  CBC - Abnormal; Notable for the following:    WBC 12.2 (*)    Platelets 142 (*)    All other components within normal limits  COMPREHENSIVE METABOLIC PANEL - Abnormal; Notable for the following:    Potassium 3.4 (*)    Glucose, Bld 119 (*)    Albumin 3.4 (*)    Total Bilirubin 2.0 (*)    GFR calc non Af Amer 71 (*)    GFR calc Af Amer 83 (*)    All other components within normal limits  TROPONIN I  LIPASE, BLOOD   Dg Chest Portable 1 View  08/31/2011  *RADIOLOGY REPORT*  Clinical Data: Chest pain and shortness of breath.  PORTABLE CHEST - 1 VIEW  Comparison: Chest x-ray 01/14/2009.  Findings: Lung volumes are normal.  Linear bibasilar opacities are favored to represent areas of subsegmental atelectasis or scarring (similar to prior).  No consolidative airspace disease.  No pleural effusions.  Pulmonary vasculature and the cardiomediastinal silhouette are within normal limits.  IMPRESSION: 1.  Minimal bibasilar subsegmental atelectasis and/or scarring.  No radiographic evidence of acute cardiopulmonary disease.  Original Report Authenticated By: Florencia Reasons, M.D.     1. Chest pain   2. Dyspnea       MDM  Pt [place] on the highway [place] am and developed epigastric/L chest burning.  Pain has been waxing and waning throughout the day today.  Says he has never had such severe pain in his chest.  Associated with dyspnea.  Worse with walking but also coming on at rest.  No other symptoms. Tachycardic but HDS.  Initial hypotension likely 2/2 nitro - hydrated in ED with improvement and holding nitro 2/2 hypotension.   Atypical hx for PE.  Concern for ACS.   Initial pain free but had recurrence of pain in ED not resolved with morphine, nitro, GI cocktail.  EKG, labs unconcerning.  D/w cards - will admit for further management.        Army Chaco, MD 08/31/11 1815

## 2011-08-31 NOTE — ED Notes (Signed)
Pt  Reported to EMS he had CP today . Pt has a HX of HTN and NIDDM. Pt was walking when his CP started. EMS gave 324mg  ASA and 2 NGT . No relief of pain . level 8/10

## 2011-09-01 ENCOUNTER — Encounter (HOSPITAL_COMMUNITY): Payer: Self-pay | Admitting: *Deleted

## 2011-09-01 LAB — CBC
MCH: 28.9 pg (ref 26.0–34.0)
MCHC: 32.7 g/dL (ref 30.0–36.0)
MCV: 88.4 fL (ref 78.0–100.0)
Platelets: 126 10*3/uL — ABNORMAL LOW (ref 150–400)
RDW: 15 % (ref 11.5–15.5)

## 2011-09-01 LAB — TSH: TSH: 2.17 u[IU]/mL (ref 0.350–4.500)

## 2011-09-01 LAB — BASIC METABOLIC PANEL
Calcium: 8.9 mg/dL (ref 8.4–10.5)
Creatinine, Ser: 0.81 mg/dL (ref 0.50–1.35)
GFR calc Af Amer: >90 mL/min (ref >90)
GFR calc non Af Amer: 87 mL/min — ABNORMAL LOW (ref >90)

## 2011-09-01 LAB — LIPID PANEL
Cholesterol: 133 mg/dL (ref 0–200)
HDL: 33 mg/dL — ABNORMAL LOW (ref >39)
Total CHOL/HDL Ratio: 4 RATIO

## 2011-09-01 LAB — CARDIAC PANEL(CRET KIN+CKTOT+MB+TROPI)
CK, MB: 2.1 ng/mL (ref 0.3–4.0)
CK, MB: 2.3 ng/mL (ref 0.3–4.0)
Total CK: 86 U/L (ref 7–232)
Troponin I: <0.3 ng/mL (ref <0.30)

## 2011-09-01 LAB — GLUCOSE, CAPILLARY

## 2011-09-01 LAB — HEMOGLOBIN A1C: Mean Plasma Glucose: 157 mg/dL — ABNORMAL HIGH (ref <117)

## 2011-09-01 LAB — HEPARIN LEVEL (UNFRACTIONATED): Heparin Unfractionated: 0.4 IU/mL (ref 0.30–0.70)

## 2011-09-01 MED ORDER — METHYLPREDNISOLONE SODIUM SUCC 125 MG IJ SOLR
60.0000 mg | Freq: Four times a day (QID) | INTRAMUSCULAR | Status: DC
  Administered 2011-09-02 – 2011-09-03 (×6): 60 mg via INTRAVENOUS
  Filled 2011-09-01: qty 0.96
  Filled 2011-09-01: qty 2
  Filled 2011-09-01 (×3): qty 0.96
  Filled 2011-09-01: qty 2
  Filled 2011-09-01 (×4): qty 0.96

## 2011-09-01 MED ORDER — SODIUM CHLORIDE 0.9 % IV SOLN
1.0000 mL/kg/h | INTRAVENOUS | Status: DC
  Administered 2011-09-02: 1 mL/kg/h via INTRAVENOUS

## 2011-09-01 MED ORDER — DIAZEPAM 5 MG PO TABS
5.0000 mg | ORAL_TABLET | ORAL | Status: AC
  Administered 2011-09-02: 5 mg via ORAL
  Filled 2011-09-01: qty 1

## 2011-09-01 MED ORDER — SENNOSIDES-DOCUSATE SODIUM 8.6-50 MG PO TABS
1.0000 | ORAL_TABLET | Freq: Two times a day (BID) | ORAL | Status: DC | PRN
  Filled 2011-09-01: qty 1

## 2011-09-01 MED ORDER — SODIUM CHLORIDE 0.9 % IJ SOLN: 3.0000 mL | INTRAMUSCULAR | Status: DC | PRN

## 2011-09-01 MED ORDER — ALUM & MAG HYDROXIDE-SIMETH 200-200-20 MG/5ML PO SUSP
30.0000 mL | ORAL | Status: DC | PRN
  Administered 2011-09-01: 30 mL via ORAL
  Filled 2011-09-01: qty 30

## 2011-09-01 MED ORDER — SODIUM CHLORIDE 0.9 % IJ SOLN: 3.0000 mL | Freq: Two times a day (BID) | INTRAMUSCULAR | Status: DC

## 2011-09-01 MED ORDER — BIOTENE DRY MOUTH MT LIQD
15.0000 mL | Freq: Two times a day (BID) | OROMUCOSAL | Status: DC
  Administered 2011-09-02 – 2011-09-08 (×9): 15 mL via OROMUCOSAL

## 2011-09-01 MED ORDER — SODIUM CHLORIDE 0.9 % IV SOLN: 250.0000 mL | INTRAVENOUS | Status: DC | PRN

## 2011-09-01 MED ORDER — ASPIRIN 81 MG PO CHEW
324.0000 mg | CHEWABLE_TABLET | ORAL | Status: AC
  Administered 2011-09-02: 324 mg via ORAL
  Filled 2011-09-01: qty 4

## 2011-09-01 MED ORDER — POTASSIUM CHLORIDE CRYS ER 20 MEQ PO TBCR
40.0000 meq | EXTENDED_RELEASE_TABLET | Freq: Once | ORAL | Status: AC
  Administered 2011-09-02: 40 meq via ORAL
  Filled 2011-09-01: qty 2

## 2011-09-01 NOTE — Progress Notes (Signed)
Subjective:  Patient complains of burning retrosternal chest pain gets better Maalox. Also had exertional chest pain yesterday while walking on the highway. Also complains of her shortness of breath with wheezing denies any fever or chills  Objective:  Vital Signs in the last 24 hours: Temp:  [97.5 F (36.4 C)-98.4 F (36.9 C)] 98.4 F (36.9 C) (05/20 1340) Pulse Rate:  [63-72] 63  (05/20 1340) Resp:  [19-24] 20  (05/20 1340) BP: (98-123)/(59-76) 123/74 mmHg (05/20 1340) SpO2:  [95 %-97 %] 97 % (05/20 1408) FiO2 (%):  [2 %] 2 % (05/19 2041) Weight:  [81.1 kg (178 lb 12.7 oz)] 81.1 kg (178 lb 12.7 oz) (05/19 2041)  Intake/Output from previous day: 05/19 0701 - 05/20 0700 In: 120 [P.O.:120] Out: 250 [Urine:250] Intake/Output from this shift:    Physical Exam: Neck: no adenopathy, no carotid bruit, no JVD and supple, symmetrical, trachea midline Lungs: Decreased breath sound at bases with bi lateral expiratory wheezing noted Heart: regular rate and rhythm, S1, S2 normal and Soft systolic murmur noted Abdomen: soft, non-tender; bowel sounds normal; no masses,  no organomegaly Extremities: extremities normal, atraumatic, no cyanosis or edema  Lab Results:  [facility] 09/01/11 0518 08/31/11 2047  WBC 8.1 10.0  HGB 13.9 15.7  PLT 126* 139*    Basename 09/01/11 0518 08/31/11 2047  NA 136 141  K 3.1* 3.4*  CL 103 104  CO2 23 27  GLUCOSE 142* 120*  BUN 15 14  CREATININE 0.81 0.96    Basename 09/01/11 0858 09/01/11 0232  TROPONINI <0.30 <0.30   Hepatic Function Panel  Basename 08/31/11 2047  PROT 6.2  ALBUMIN 3.3*  AST 12  ALT 11  ALKPHOS 72  BILITOT 1.9*  BILIDIR --  IBILI --    Basename 09/01/11 0518  CHOL 133   No results found for this basename: PROTIME in the last 72 hours  Imaging: Imaging results have been reviewed and Dg Chest Portable 1 View  08/31/2011  *RADIOLOGY REPORT*  Clinical Data: Chest pain and shortness of breath.  PORTABLE CHEST - 1 VIEW   Comparison: Chest x-ray 01/14/2009.  Findings: Lung volumes are normal.  Linear bibasilar opacities are favored to represent areas of subsegmental atelectasis or scarring (similar to prior).  No consolidative airspace disease.  No pleural effusions.  Pulmonary vasculature and the cardiomediastinal silhouette are within normal limits.  IMPRESSION: 1.  Minimal bibasilar subsegmental atelectasis and/or scarring.  No radiographic evidence of acute cardiopulmonary disease.  Original Report Authenticated By: Florencia Reasons, M.D.    Cardiac Studies:  Assessment/Plan:  Acute coronary syndrome rule out MI  Exacerbation of COPD  Hypertension  Non-insulin-dependent diabetes mellitus  History of tobacco abuse  History of alcohol abuse  Positive family history of coronary artery disease GERD Plan And IV Solu-Medrol as per orders Discussed with patient regarding left eye possible PTCA stenting this risk and benefits i.e. death MI stroke need for emergency CABG local vascular complications etc. and consents for PCI  LOS: 1 day    Aleigha Gilani N 09/01/2011, 7:44 PM

## 2011-09-01 NOTE — Progress Notes (Signed)
ANTICOAGULATION CONSULT NOTE   Pharmacy Consult for Heparin Indication: chest pain/ACS  No Known Allergies  Patient Measurements: Height: 5\' 8"  (172.7 cm) Weight: 178 lb 12.7 oz (81.1 kg) IBW/kg (Calculated) : 68.4  Heparin Dosing Weight: 81.1 kg  Vital Signs: Temp: 98.3 F (36.8 C) (05/20 0500) Temp src: Oral (05/19 1931) BP: 98/59 mmHg (05/20 0500) Pulse Rate: 67  (05/20 0500)  Labs:  Basename 09/01/11 0518 09/01/11 0232 08/31/11 2047 08/31/11 2046 08/31/11 1520  HGB 13.9 -- 15.7 -- --  HCT 42.5 -- 46.1 -- 47.4  PLT 126* -- 139* -- 142*  APTT -- -- 30 -- --  LABPROT -- -- 13.8 -- --  INR -- -- 1.04 -- --  HEPARINUNFRC 0.40 -- -- -- --  CREATININE -- -- 0.96 -- 1.02  CKTOTAL -- 86 -- 97 --  CKMB -- 2.1 -- 2.2 --  TROPONINI -- <0.30 -- <0.30 <0.30    Estimated Creatinine Clearance: 67.3 ml/min (by C-G formula based on Cr of 0.96).  Assessment: 71 yo male with chest pain for Heparin.   Goal of Therapy:  Heparin level 0.3-0.7 units/ml Monitor platelets by anticoagulation protocol: Yes   Plan:  Continue Heparin at current rate F/U plan  Jaziya Obarr, Gary Fleet 09/01/2011,6:54 AM

## 2011-09-01 NOTE — ED Provider Notes (Signed)
I have personally seen and examined the patient.  I have discussed the plan of care with the resident.  I have reviewed the documentation on PMH/FH/Soc. History.  I have reviewed the documentation of the resident and agree.  Doug Sou, MD 09/01/11 [phone]

## 2011-09-01 NOTE — Clinical Social Work Psychosocial (Signed)
     Clinical Social Work Department BRIEF PSYCHOSOCIAL ASSESSMENT 09/01/2011  Patient:  Jeremiah Burnett, Jeremiah Burnett     Account Number:  [id number]     Admit date:  08/31/2011  Clinical Social Worker:  Doree Albee  Date/Time:  09/01/2011 03:45 PM  Referred by:  RN  Date Referred:  09/01/2011 Referred for  Homelessness   Other Referral:   Interview type:  Patient Other interview type:    PSYCHOSOCIAL DATA Living Status:  OTHER Admitted from facility:   Level of care:   Primary support name:  none Primary support relationship to patient:   Degree of support available:   non    CURRENT CONCERNS Current Concerns  Post-Acute Placement   Other Concerns:    SOCIAL WORK ASSESSMENT / PLAN CSW met with pt at bedside to discuss pt current living environment and pt discharge plans. Pt stated that he was traveling with a friend to [facility] when he had a falling out with his friend. Pt did not wish to discuss further what the falling out was over but stated, [place] [facility]. Pt had been walking and hitch hiking for a ride when he arrived in [place] and became ill.    pt stated that he plans to wait for his check or start moving towards [place] by walking and hitch hiking on his way to [place]. Pt agreed for CSW to look into a shelter for patient while he waits for his SSI check to arrive [place]    CSW provided patient with list of shelters. Pt stated that he has stayed in [facility] previouslly approximately 2 years ago.    CSW will contact [employer] regarding [facility] shelter. Pt denies any past criminal record, has photo id, and has not been to shelter in over a year.   Assessment/plan status:  Psychosocial Support/Ongoing Assessment of Needs Other assessment/ plan:   and discharge planning   Information/referral to community resources:   shelter list.      PATIENTS/FAMILYS RESPONSE TO PLAN OF CARE: Pt appreciative of csw concern and support. Pt stated, "i'm willing to go wherever you can send me. I really do not want to sleep under a bridge. I dont wish that on anyone."

## 2011-09-01 NOTE — Progress Notes (Signed)
Utilization review completed.  

## 2011-09-02 ENCOUNTER — Encounter (HOSPITAL_COMMUNITY)
Admission: EM | Disposition: A | Discharge status: Home / Self Care | Payer: Self-pay | Source: Ambulatory Visit | Attending: Cardiology

## 2011-09-02 ENCOUNTER — Ambulatory Visit (HOSPITAL_COMMUNITY): Admit: 2011-09-02 | Payer: Self-pay | Admitting: Cardiology

## 2011-09-02 HISTORY — PX: LEFT HEART CATHETERIZATION WITH CORONARY ANGIOGRAM: SHX5451

## 2011-09-02 LAB — GLUCOSE, CAPILLARY
Glucose-Capillary: 161 mg/dL — ABNORMAL HIGH (ref 70–99)
Glucose-Capillary: 125 mg/dL — ABNORMAL HIGH (ref 70–99)

## 2011-09-02 LAB — CBC
MCH: 29.3 pg (ref 26.0–34.0)
MCHC: 33.3 g/dL (ref 30.0–36.0)
MCV: 88.1 fL (ref 78.0–100.0)
Platelets: 129 10*3/uL — ABNORMAL LOW (ref 150–400)
RDW: 14.7 % (ref 11.5–15.5)

## 2011-09-02 LAB — BASIC METABOLIC PANEL
BUN: 8 mg/dL (ref 6–23)
CO2: 24 mEq/L (ref 19–32)
Calcium: 9.1 mg/dL (ref 8.4–10.5)
Creatinine, Ser: 0.71 mg/dL (ref 0.50–1.35)
Glucose, Bld: 167 mg/dL — ABNORMAL HIGH (ref 70–99)

## 2011-09-02 SURGERY — LEFT HEART CATHETERIZATION WITH CORONARY ANGIOGRAM
Anesthesia: LOCAL

## 2011-09-02 MED ORDER — SODIUM CHLORIDE 0.9 % IV SOLN: INTRAVENOUS | Status: AC

## 2011-09-02 MED ORDER — HEPARIN (PORCINE) IN NACL 2-0.9 UNIT/ML-% IJ SOLN
INTRAMUSCULAR | Status: AC
  Filled 2011-09-02: qty 1000

## 2011-09-02 MED ORDER — ACETAMINOPHEN 325 MG PO TABS
650.0000 mg | ORAL_TABLET | ORAL | Status: DC | PRN
  Administered 2011-09-07: 650 mg via ORAL
  Filled 2011-09-02: qty 2

## 2011-09-02 MED ORDER — FENTANYL CITRATE 0.05 MG/ML IJ SOLN
INTRAMUSCULAR | Status: AC
  Filled 2011-09-02: qty 2

## 2011-09-02 MED ORDER — NITROGLYCERIN 0.2 MG/ML ON CALL CATH LAB
INTRAVENOUS | Status: AC
  Filled 2011-09-02: qty 1

## 2011-09-02 MED ORDER — CEFAZOLIN SODIUM 1-5 GM-% IV SOLN
INTRAVENOUS | Status: AC
  Filled 2011-09-02: qty 50

## 2011-09-02 MED ORDER — LIDOCAINE HCL (PF) 1 % IJ SOLN
INTRAMUSCULAR | Status: AC
  Filled 2011-09-02: qty 30

## 2011-09-02 MED ORDER — MIDAZOLAM HCL 2 MG/2ML IJ SOLN
INTRAMUSCULAR | Status: AC
  Filled 2011-09-02: qty 2

## 2011-09-02 MED ORDER — ONDANSETRON HCL 4 MG/2ML IJ SOLN: 4.0000 mg | Freq: Four times a day (QID) | INTRAMUSCULAR | Status: DC | PRN

## 2011-09-02 NOTE — H&P (Signed)
  H&P as above no changes

## 2011-09-02 NOTE — Cardiovascular Report (Signed)
Jeremiah Burnett, Jeremiah Burnett              ACCOUNT NO.:  [id number]  MEDICAL RECORD NO.:  [id number]  LOCATION:  [facility]                         FACILITY:  [facility]  PHYSICIAN:  Eduardo Osier. Sharyn Lull, M.D. DATE OF BIRTH:  09/27/1940  DATE OF PROCEDURE:  09/02/2011 DATE OF DISCHARGE:                           CARDIAC CATHETERIZATION   PROCEDURE:  Left cardiac cath with selective left and right coronary angiography, LV graphy, aortography via right groin using Judkins technique.  INDICATION FOR THE PROCEDURE:  Jeremiah Burnett is 71 year old male with past medical history significant for hypertension, non-insulin-dependent diabetes mellitus, COPD, hypercholesteremia, history of tobacco abuse, history of alcohol abuse, positive family history of coronary artery disease.  He came to the ER complaining of recurrent retrosternal chest pain described as burning, heaviness, radiating across the chest associated with shortness of breath and dizziness while walking on the highway, off and on, since this morning.  The patient received 4 baby aspirin, sublingual nitro with partial relief of chest pain in the ER and was noted to be in sinus tachycardia with hypotension requiring fluid challenge.  EKG showed sinus tach with no acute ischemic changes. The patient states he has been walking since yesterday from [facility] and developed exertional chest pain, relieved initially with rest, but again had recurrent chest pain as he continued to walk.  The patient denies any nausea, vomiting, diaphoresis.  Denies palpitation, lightheadedness, or syncope, but felt dizzy.  Denies any relation of chest pain to food, breathing, or movement.  Denies any cardiac workup in the past.  Due to typical anginal chest pain and multiple risk factors, I discussed with the patient regarding noninvasive stress testing versus left cath, possible PTCA with stenting, its risks and benefits, i.e., death, MI, stroke, need for emergency CABG,  risk of restenosis, local vascular complications, etc., and consented for PCI.  PROCEDURE:  After obtaining the informed consent, the patient was brought to the cath lab and was placed on fluoroscopy table.  Right groin was prepped and draped in usual fashion.  Xylocaine 1% was used for local anesthesia in the right groin.  With the help of thin wall needle, a 5-French arterial sheath was placed.  The sheath was aspirated and flushed.  A 5-French left Judkins catheter was advanced over the wire under fluoroscopic guidance up to the ascending aorta.  Wire was pulled out. The catheter was aspirated and connected to the Manifold.  Catheter was further advanced and engaged into left coronary ostium.  Multiple views of the left system were taken.  The catheter was disengaged and was pulled out over the wire and was replaced with a 5-French right Judkins catheter, which was advanced over the wire under fluoroscopic guidance up to the ascending aorta.  Wire was pulled out.  The catheter was aspirated and connected to the Manifold.  Catheter was further advanced and engaged into right coronary ostium.  Multiple views of the right system were taken.  The catheter was disengaged and was pulled out over the wire and was replaced with a 5-French pigtail catheter, which was advanced over the wire under fluoroscopic guidance up to the ascending aorta.  Wire was pulled out.  The catheter was aspirated and  connected to the Manifold. Catheter was further advanced across the aortic valve into the LV.  LV pressures were recorded.  LV graphy was done in 30-degree RAO position.  Post-angiographic pressures were recorded from LV and then pullback pressures were recorded from the aorta.  There was no gradient across the aortic valve.  The pigtail catheter was pulled down up to the abdominal aorta.  The aortography was done in PA position.  The pigtail catheter was pulled out over the wire.  Sheaths  were aspirated and flushed.  FINDINGS:  LV showed good LV systolic function.  EF of 50-55%. Aortography showed no aortic aneurysm.  The left main was short, which was patent.  LAD has 15-20% proximal and mid stenosis.  Diagonal 1 and 2 very small, which have mild disease.  Ramus was very, very small.  Left circumflex has 10-15% mid stenosis.  OM 1 is very, very small.  OM 2 is moderate size, which is patent.  RCA has 20-25% ostial, proximal, mid, and distal stenosis.  PDA and PLV branches have mild disease.  The patient tolerated procedure well.  There were no complications.  The patient was transferred to recovery room in stable condition.     Eduardo Osier. Sharyn Lull, M.D.     MNH/MEDQ  D:  09/02/2011  T:  09/02/2011  Job:  [id number]

## 2011-09-02 NOTE — Progress Notes (Signed)
Utilization review completed.  

## 2011-09-02 NOTE — Plan of Care (Signed)
Problem: Consults Goal: Cardiac Cath Patient Education (See Patient Education module for education specifics.) Outcome: Completed/Met Date Met:  09/01/11 Patient declined cardiac catheterization video and care notes.  MD has explained procedure to patient.  Verbal education provided.

## 2011-09-02 NOTE — Care Management Note (Signed)
    Page 1 of 1   09/02/2011     2:47:26 PM   CARE MANAGEMENT NOTE 09/02/2011  Patient:  Jeremiah Burnett, Jeremiah Burnett   Account Number:  [id number]  Date Initiated:  09/02/2011  Documentation initiated by:  Donn Pierini  Subjective/Objective Assessment:   Pt admitted with chest pain, s/p cath today     Action/Plan:   PTA pt was homeless- CSW consulted   Anticipated DC Date:  09/03/2011   Anticipated DC Plan:  HOME/SELF CARE  In-house referral  Clinical Social Worker      DC Planning Services  CM consult      Choice offered to / List presented to:             Status of service:  In process, will continue to follow Medicare Important Message given?   (If response is "NO", the following Medicare IM given date fields will be blank) Date Medicare IM given:   Date Additional Medicare IM given:    Discharge Disposition:    Per UR Regulation:  Reviewed for med. necessity/level of care/duration of stay  If discussed at Long Length of Stay Meetings, dates discussed:    Comments:  09/02/11- 1440- Donn Pierini RN, BSN [phone] Referral received for medication assistance- spoke with pt at bedside- per conversation pt states that he has both medicare  and current medicaid card- but does not have cash until he gets his check at the first of the month. Has pt has medicaid- he should be able to get medications- at this time- do not know what medications he will be discharged with. CSW following for discharge to shelter. NCM to continue to follow. Pt is not eligible for assistance through indigent fund because he does have insurance.

## 2011-09-02 NOTE — Progress Notes (Signed)
Pt states he quit smoking about 1 month ago and has remained tobacco free since. He says he did it cold [place]. Congratulated and encouraged pt to remain tobacco free. Discussed relapse prevention strategies. Referred to 1-800 quit now for f/u and support. Discussed oral fixation substitutes, second hand smoke and in home smoking policy. Reviewed and gave pt Written education/contact information.

## 2011-09-02 NOTE — CV Procedure (Signed)
Left cardiac cath report dictated on 09/02/2011 dictation number is [id number]

## 2011-09-03 LAB — GLUCOSE, CAPILLARY: Glucose-Capillary: 197 mg/dL — ABNORMAL HIGH (ref 70–99)

## 2011-09-03 MED ORDER — METHYLPREDNISOLONE SODIUM SUCC 125 MG IJ SOLR
60.0000 mg | Freq: Three times a day (TID) | INTRAMUSCULAR | Status: DC
  Administered 2011-09-03 – 2011-09-04 (×2): 60 mg via INTRAVENOUS
  Filled 2011-09-03 (×3): qty 0.96

## 2011-09-03 MED ORDER — GLIMEPIRIDE 4 MG PO TABS
4.0000 mg | ORAL_TABLET | Freq: Every day | ORAL | Status: DC
  Administered 2011-09-04 – 2011-09-07 (×4): 4 mg via ORAL
  Filled 2011-09-03 (×6): qty 1

## 2011-09-03 MED ORDER — INSULIN ASPART 100 UNIT/ML ~~LOC~~ SOLN
5.0000 [IU] | Freq: Once | SUBCUTANEOUS | Status: AC
  Administered 2011-09-03: 5 [IU] via SUBCUTANEOUS

## 2011-09-03 NOTE — Progress Notes (Signed)
CSW spoke with pt regarding shelter availability. Pt is anticipated for dc tomorrow. CSW will discuss with [facility], [place], and [facility] regarding bed availability tomorrow morning as bed holds are not available.   Pt prefers [facility] or [facility]. Pt understands that whatever shelter is available and transportation is available will determine pt disposition. Pt stated he will go to [place] if absolutely necessary.   Catha Gosselin, Theresia Majors  [phone] .09/03/2011 1629pm

## 2011-09-03 NOTE — Progress Notes (Signed)
CSW met with pt at bedside to discuss pt dc plans. Pt states he still is in need of a shelter until pt check arrives. CSW spoke with [facility] in [place] and unfortunately there are no male beds available. CSW spoke with [facility] in [place] who are stated that beds at this time are unkown until 2pm. CSW informed patient of options. Pt wants to stay in [place] area until pt check arrives.   .Clinical social worker continuing to follow pt to assist with pt dc plans and further csw needs.   Catha Gosselin, Theresia Majors  [phone] .09/03/2011 10:20am

## 2011-09-03 NOTE — Progress Notes (Signed)
Subjective:  Patient complains of epigastric burning pain denies any chest pain breathing has improved on IV steroids  Objective:  Vital Signs in the last 24 hours: Temp:  [97.6 F (36.4 C)-98.3 F (36.8 C)] 97.6 F (36.4 C) (05/22 0559) Pulse Rate:  [57-86] 86  (05/22 0559) Resp:  [18-22] 21  (05/22 0559) BP: (123-138)/(73-92) 138/80 mmHg (05/22 0559) SpO2:  [92 %-99 %] 97 % (05/22 0929) FiO2 (%):  [28 %] 28 % (05/21 1413)  Intake/Output from previous day: 05/21 0701 - 05/22 0700 In: 480 [P.O.:480] Out: 400 [Urine:400] Intake/Output from this shift:    Physical Exam: Neck: no adenopathy, no carotid bruit, no JVD and supple, symmetrical, trachea midline Lungs: Decreased breath sound at bases with occasional expiratory wheezing Heart: regular rate and rhythm, S1, S2 normal, no murmur, click, rub or gallop Abdomen: soft, non-tender; bowel sounds normal; no masses,  no organomegaly Extremities: extremities normal, atraumatic, no cyanosis or edema and Her right groin stable with no evidence of hematoma or bruit  Lab Results:  Basename 09/02/11 0530 09/01/11 0518  WBC 8.2 8.1  HGB 14.5 13.9  PLT 129* 126*    Basename 09/02/11 0530 09/01/11 0518  NA 138 136  K 4.3 3.1*  CL 105 103  CO2 24 23  GLUCOSE 167* 142*  BUN 8 15  CREATININE 0.71 0.81    Basename 09/01/11 0858 09/01/11 0232  TROPONINI <0.30 <0.30   Hepatic Function Panel  Basename 08/31/11 2047  PROT 6.2  ALBUMIN 3.3*  AST 12  ALT 11  ALKPHOS 72  BILITOT 1.9*  BILIDIR --  IBILI --    Basename 09/01/11 0518  CHOL 133   No results found for this basename: PROTIME in the last 72 hours  Imaging: Imaging results have been reviewed and No results found.  Cardiac Studies:  Assessment/Plan:  Status post chest pain status post left cath Mild coronary artery disease Resolving Exacerbation of COPD  Hypertension  Non-insulin-dependent diabetes mellitus  History of tobacco abuse  History of alcohol  abuse  Positive family history of coronary artery disease  GERD Plan Taper Solu-Medrol as per orders Social worker for discharge planning  LOS: 3 days    Jefferie Holston N 09/03/2011, 1:23 PM

## 2011-09-04 LAB — GLUCOSE, CAPILLARY
Glucose-Capillary: 258 mg/dL — ABNORMAL HIGH (ref 70–99)
Glucose-Capillary: 208 mg/dL — ABNORMAL HIGH (ref 70–99)
Glucose-Capillary: 224 mg/dL — ABNORMAL HIGH (ref 70–99)

## 2011-09-04 MED ORDER — PREDNISONE (PAK) 5 MG PO TABS: ORAL_TABLET | ORAL | Status: DC

## 2011-09-04 MED ORDER — PREDNISONE (PAK) 5 MG PO TABS
5.0000 mg | ORAL_TABLET | Freq: Three times a day (TID) | ORAL | Status: AC
  Administered 2011-09-05 (×3): 5 mg via ORAL

## 2011-09-04 MED ORDER — PREDNISONE (PAK) 5 MG PO TABS
5.0000 mg | ORAL_TABLET | ORAL | Status: AC
  Administered 2011-09-04: 5 mg via ORAL

## 2011-09-04 MED ORDER — PREDNISONE (PAK) 5 MG PO TABS
5.0000 mg | ORAL_TABLET | Freq: Four times a day (QID) | ORAL | Status: DC
  Administered 2011-09-06 – 2011-09-08 (×8): 5 mg via ORAL

## 2011-09-04 MED ORDER — PREDNISONE (PAK) 5 MG PO TABS
10.0000 mg | ORAL_TABLET | Freq: Every morning | ORAL | Status: AC
  Administered 2011-09-04: 10 mg via ORAL
  Filled 2011-09-04: qty 21

## 2011-09-04 MED ORDER — PREDNISONE (PAK) 5 MG PO TABS
10.0000 mg | ORAL_TABLET | Freq: Every evening | ORAL | Status: AC
  Administered 2011-09-05: 10 mg via ORAL

## 2011-09-04 MED ORDER — PREDNISONE (PAK) 5 MG PO TABS
10.0000 mg | ORAL_TABLET | Freq: Every evening | ORAL | Status: AC
  Administered 2011-09-04: 10 mg via ORAL

## 2011-09-04 NOTE — Progress Notes (Signed)
Subjective:  Patient complaining of significant shortness of breath associated with wheezing. Denies any cough fever or chills. Denies chest pain  Objective:  Vital Signs in the last 24 hours: Temp:  [97 F (36.1 C)-98.6 F (37 C)] 97 F (36.1 C) (05/23 0521) Pulse Rate:  [52-68] 52  (05/23 0521) Resp:  [18-19] 18  (05/23 0521) BP: (107-126)/(55-72) 107/55 mmHg (05/23 0521) SpO2:  [97 %-98 %] 98 % (05/23 0739)  Intake/Output from previous day: 05/22 0701 - 05/23 0700 In: 600 [P.O.:600] Out: 1075 [Urine:1075] Intake/Output from this shift:    Physical Exam: Neck: no adenopathy, no carotid bruit, no JVD and supple, symmetrical, trachea midline Lungs: Decreased breath sound at bases with bilateral l expiratory wheezing Heart: regular rate and rhythm, S1, S2 normal and Soft systolic murmur noted Abdomen: soft, non-tender; bowel sounds normal; no masses,  no organomegaly Extremities: extremities normal, atraumatic, no cyanosis or edema  Lab Results:  Basename 09/02/11 0530  WBC 8.2  HGB 14.5  PLT 129*    Basename 09/02/11 0530  NA 138  K 4.3  CL 105  CO2 24  GLUCOSE 167*  BUN 8  CREATININE 0.71   No results found for this basename: TROPONINI:2,CK,MB:2 in the last 72 hours Hepatic Function Panel No results found for this basename: PROT,ALBUMIN,AST,ALT,ALKPHOS,BILITOT,BILIDIR,IBILI in the last 72 hours No results found for this basename: CHOL in the last 72 hours No results found for this basename: PROTIME in the last 72 hours  Imaging: Imaging results have been reviewed and No results found.  Cardiac Studies:  Assessment/Plan:  Status post chest pain status post left cath  Mild coronary artery disease  Resolving Exacerbation of COPD  Hypertension  Non-insulin-dependent diabetes mellitus  History of tobacco abuse  History of alcohol abuse  Positive family history of coronary artery disease  GERD Plan As per orders Possible discharge tomorrow if stable  LOS: 4 days    Lars Jeziorski N 09/04/2011, 12:11 PM

## 2011-09-04 NOTE — Progress Notes (Signed)
CSW aware of pt delayed discharge. CSW informed [employer]. CSW will assist with pt dc plans to homeless shelter when medically stable.   .Clinical social worker continuing to follow pt to assist with pt dc plans and further csw needs.   Catha Gosselin, Theresia Majors  [phone] .09/04/2011 1245pm

## 2011-09-04 NOTE — Progress Notes (Signed)
Pt has bed available at [facility] shelter and will need to get there asap to hold bed. .Clinical social worker continuing to follow pt to assist with pt dc plans and further csw needs.   Catha Gosselin, Theresia Majors  [phone] .09/04/2011 9:51am

## 2011-09-05 LAB — GLUCOSE, CAPILLARY: Glucose-Capillary: 171 mg/dL — ABNORMAL HIGH (ref 70–99)

## 2011-09-05 NOTE — Progress Notes (Signed)
CSW confirmed with shelter that bed is still available for pt. However, as the day continues there is a chance bed may be given away to another person in need.   .Clinical social worker continuing to follow pt to assist with pt dc plans and further csw needs.   Catha Gosselin, Theresia Majors  [phone] .09/05/2011 1541pm

## 2011-09-05 NOTE — Progress Notes (Signed)
Utilization review completed.  

## 2011-09-05 NOTE — Progress Notes (Signed)
CSW assisted with pt dc plan [facility]. Pt was provided with 2 bus passes and a letter stating that patient will require bed rest in the lobby of the facility. .No further Clinical Social Work needs, signing off.    Catha Gosselin, Theresia Majors  [phone] .09/05/2011 1734pm

## 2011-09-05 NOTE — Progress Notes (Signed)
CSW spoke to [facility] today and confirmed that there are still male beds available today. However it is on a first come first serve basis and patient will need to arrive as soon as possible today. CSW awaiting to see if pt will discharge. Beds are expected to be available over the weekend but not guaranteed.   .Clinical social worker continuing to follow pt to assist with pt dc plans and further csw needs.   Catha Gosselin, Theresia Majors  [phone] .09/05/2011 1004am

## 2011-09-05 NOTE — Progress Notes (Signed)
Pt has been weined off of oxygen and is currently satting 95% on RA at rest. Pt ambulated 400 ft and dropped to 90%. BY the end of walk, pt was very wheezy but denied SOB or CP. Pt assisted back to bed and sats came back up to 94-96%. Will continue to monitor patient closely. Ramond Craver, RN

## 2011-09-05 NOTE — Progress Notes (Signed)
Subjective:  Patient denies any chest pain but complains of several shortness of breath with minimal exertion and wheezing states hopefully will go to [facility] tomorrow if His breathing improves Objective:  Vital Signs in the last 24 hours: Temp:  [97.6 F (36.4 C)-97.8 F (36.6 C)] 97.8 F (36.6 C) (05/24 1403) Pulse Rate:  [52-66] 65  (05/24 1403) Resp:  [18-20] 18  (05/24 1403) BP: (119-130)/(74-84) 119/74 mmHg (05/24 1403) SpO2:  [94 %-100 %] 94 % (05/24 1542)  Intake/Output from previous day: 05/23 0701 - 05/24 0700 In: 960 [P.O.:960] Out: 1075 [Urine:1075] Intake/Output from this shift: Total I/O In: 240 [P.O.:240] Out: -   Physical Exam: Neck: no adenopathy, no carotid bruit, no JVD and supple, symmetrical, trachea midline Lungs: Decrease vessel at bases with occasional expiratory wheezing Heart: regular rate and rhythm, S1, S2 normal, no murmur, click, rub or gallop Abdomen: soft, non-tender; bowel sounds normal; no masses,  no organomegaly Extremities: extremities normal, atraumatic, no cyanosis or edema  Lab Results: No results found for this basename: WBC:2,HGB:2,PLT:2 in the last 72 hours No results found for this basename: NA:2,K:2,CL:2,CO2:2,GLUCOSE:2,BUN:2,CREATININE:2 in the last 72 hours No results found for this basename: TROPONINI:2,CK,MB:2 in the last 72 hours Hepatic Function Panel No results found for this basename: PROT,ALBUMIN,AST,ALT,ALKPHOS,BILITOT,BILIDIR,IBILI in the last 72 hours No results found for this basename: CHOL in the last 72 hours No results found for this basename: PROTIME in the last 72 hours  Imaging: Imaging results have been reviewed and No results found.  Cardiac Studies:  Assessment/Plan:  Status post chest pain status post left cath  Mild coronary artery disease  Resolving Exacerbation of COPD  Hypertension  Non-insulin-dependent diabetes mellitus  History of tobacco abuse  History of alcohol abuse  Positive  family history of coronary artery disease  GERD Plan Continue present management Okay to DC home if stable Dr. August Saucer on call for weekend  LOS: 5 days    Oran Dillenburg N 09/05/2011, 6:14 PM

## 2011-09-06 LAB — GLUCOSE, CAPILLARY
Glucose-Capillary: 142 mg/dL — ABNORMAL HIGH (ref 70–99)
Glucose-Capillary: 96 mg/dL (ref 70–99)
Glucose-Capillary: 180 mg/dL — ABNORMAL HIGH (ref 70–99)

## 2011-09-06 NOTE — Progress Notes (Signed)
Subjective:  Patient complaining of more exertional dyspnea today than yesterday. Denies any increase chest pain. He has a nonproductive cough. He's been taking the nebulizer treatments as scheduled. He is unsure if he can manage his dyspnea at home. Patient is presently staying in a shelter at this time. Questions regarding obtaining his medications consistently as well. Patient denies smoking but acknowledges secondhand smoke exposure.   No Known Allergies Current Facility-Administered Medications  Medication Dose Route Frequency Provider Last Rate Last Dose  . acetaminophen (TYLENOL) tablet 650 mg  650 mg Oral Q4H PRN Robynn Pane, MD      . ALPRAZolam Prudy Feeler) tablet 0.25 mg  0.25 mg Oral BID PRN Robynn Pane, MD      . alum & mag hydroxide-simeth (MAALOX/MYLANTA) 200-200-20 MG/5ML suspension 30 mL  30 mL Oral PRN Robynn Pane, MD   30 mL at 09/01/11 1619  . antiseptic oral rinse (BIOTENE) solution 15 mL  15 mL Mouth Rinse BID Robynn Pane, MD   15 mL at 09/06/11 0810  . aspirin EC tablet 81 mg  81 mg Oral Daily Robynn Pane, MD   81 mg at 09/06/11 1002  . atorvastatin (LIPITOR) tablet 80 mg  80 mg Oral q1800 Robynn Pane, MD   80 mg at 09/05/11 1708  . clopidogrel (PLAVIX) tablet 75 mg  75 mg Oral Q breakfast Robynn Pane, MD   75 mg at 09/06/11 0811  . famotidine (PEPCID) tablet 20 mg  20 mg Oral BID Robynn Pane, MD   20 mg at 09/06/11 1002  . Fluticasone-Salmeterol (ADVAIR) 250-50 MCG/DOSE inhaler 1 puff  1 puff Inhalation BID Robynn Pane, MD   1 puff at 09/06/11 [phone]  . glimepiride (AMARYL) tablet 4 mg  4 mg Oral Q breakfast Robynn Pane, MD   4 mg at 09/06/11 0809  . insulin aspart (novoLOG) injection 0-15 Units  0-15 Units Subcutaneous TID WC Robynn Pane, MD   2 Units at 09/06/11 0809  . levalbuterol (XOPENEX) nebulizer solution 1.25 mg  1.25 mg Nebulization TID Robynn Pane, MD   1.25 mg at 09/06/11 1354  . levalbuterol (XOPENEX) nebulizer solution  1.25 mg  1.25 mg Nebulization Q4H PRN Robynn Pane, MD   1.25 mg at 09/01/11 0248  . nitroGLYCERIN (NITROSTAT) SL tablet 0.4 mg  0.4 mg Sublingual Q5 Min x 3 PRN Robynn Pane, MD      . ondansetron (ZOFRAN) injection 4 mg  4 mg Intravenous Q6H PRN Robynn Pane, MD      . predniSONE (STERAPRED UNI-PAK) tablet 10 mg  10 mg Oral Nightly Robynn Pane, MD   10 mg at 09/05/11 2111  . predniSONE (STERAPRED UNI-PAK) tablet 5 mg  5 mg Oral 3 x daily with food Robynn Pane, MD   5 mg at 09/05/11 1708  . predniSONE (STERAPRED UNI-PAK) tablet 5 mg  5 mg Oral 4X daily taper Robynn Pane, MD   5 mg at 09/06/11 1424  . tiotropium (SPIRIVA) inhalation capsule 18 mcg  18 mcg Inhalation Daily Robynn Pane, MD   18 mcg at 09/05/11 1047    Objective: Blood pressure 139/83, pulse 65, temperature 97.6 F (36.4 C), temperature source Oral, resp. rate 20, height 5\' 8"  (1.727 m), weight 178 lb 12.7 oz (81.1 kg), SpO2 97.00%.  Well-developed well-nourished white male in mild respiratory distress. HEENT: No sinus tenderness. No sclera icterus. NECK: No enlarged thyroid. No  posterior cervical nodes. LUNGS: Distant breath sounds. No extra wheezes bilaterally. No vocal fremitus. CV: Normal S1, S2. ABD: Nontender. MSK: Negative Homans. NEURO: Nonfocal.  Lab results: Results for orders placed during the hospital encounter of 08/31/11 (from the past 48 hour(s))  GLUCOSE, CAPILLARY     Status: Abnormal   Collection Time   09/04/11  8:37 PM      Component Value Range Comment   Glucose-Capillary 224 (*) 70 - 99 (mg/dL)    Comment 1 Notify RN     GLUCOSE, CAPILLARY     Status: Abnormal   Collection Time   09/05/11  7:31 AM      Component Value Range Comment   Glucose-Capillary 171 (*) 70 - 99 (mg/dL)   GLUCOSE, CAPILLARY     Status: Abnormal   Collection Time   09/05/11 11:59 AM      Component Value Range Comment   Glucose-Capillary 152 (*) 70 - 99 (mg/dL)   GLUCOSE, CAPILLARY     Status:  Abnormal   Collection Time   09/05/11  4:42 PM      Component Value Range Comment   Glucose-Capillary 108 (*) 70 - 99 (mg/dL)    Comment 1 Notify RN     GLUCOSE, CAPILLARY     Status: Abnormal   Collection Time   09/05/11  8:59 PM      Component Value Range Comment   Glucose-Capillary 175 (*) 70 - 99 (mg/dL)    Comment 1 Notify RN     GLUCOSE, CAPILLARY     Status: Abnormal   Collection Time   09/06/11  7:51 AM      Component Value Range Comment   Glucose-Capillary 142 (*) 70 - 99 (mg/dL)    Comment 1 Notify RN     GLUCOSE, CAPILLARY     Status: Normal   Collection Time   09/06/11 11:31 AM      Component Value Range Comment   Glucose-Capillary 96  70 - 99 (mg/dL)    Comment 1 Notify RN       Studies/Results: No results found.  Patient Active Problem List  Diagnoses  . Acute coronary syndrome    Impression: Acute coronary syndrome improved. Mild respiratory failure with exacerbation. COPD. Unstable home situation.   Plan: Continue present treatments. Reevaluate in a.m. for possible discharge. Need to ensure patient of his medications for continued control of his respiratory insufficiency. Continue oral steroids as well.   August Saucer, Klaryssa Fauth 09/06/2011 4:57 PM

## 2011-09-07 LAB — GLUCOSE, CAPILLARY: Glucose-Capillary: 252 mg/dL — ABNORMAL HIGH (ref 70–99)

## 2011-09-07 MED ORDER — GLIMEPIRIDE 2 MG PO TABS
2.0000 mg | ORAL_TABLET | Freq: Every day | ORAL | Status: DC
  Administered 2011-09-08: 2 mg via ORAL
  Filled 2011-09-07 (×2): qty 1

## 2011-09-07 NOTE — Progress Notes (Signed)
CRITICAL VALUE ALERT  Critical value received:  Glucose 69  Date of notification:  09/06/11   Time of notification:  1200  Critical value read back:yes  Nurse who received alert:  Burnard Hawthorne RN  MD notified (1st page):    Time of first page:    MD notified (2nd page):  Time of second page:  Responding MD:    Time MD responded:

## 2011-09-07 NOTE — Progress Notes (Signed)
Subjective:  Patient has continued to have exertional dyspnea. No new chest pain or diaphoresis. Experienced hypoglycemia earlier today with excessive nervousness approximately 15 minutes before his next meal. (per patient). He has not had similar episodes in the past. He has been eating consistently. No new complaints.   No Known Allergies Current Facility-Administered Medications  Medication Dose Route Frequency Provider Last Rate Last Dose  . acetaminophen (TYLENOL) tablet 650 mg  650 mg Oral Q4H PRN Robynn Pane, MD   650 mg at 09/07/11 1355  . ALPRAZolam (XANAX) tablet 0.25 mg  0.25 mg Oral BID PRN Robynn Pane, MD      . alum & mag hydroxide-simeth (MAALOX/MYLANTA) 200-200-20 MG/5ML suspension 30 mL  30 mL Oral PRN Robynn Pane, MD   30 mL at 09/01/11 1619  . antiseptic oral rinse (BIOTENE) solution 15 mL  15 mL Mouth Rinse BID Robynn Pane, MD   15 mL at 09/07/11 0816  . aspirin EC tablet 81 mg  81 mg Oral Daily Robynn Pane, MD   81 mg at 09/07/11 1101  . atorvastatin (LIPITOR) tablet 80 mg  80 mg Oral q1800 Robynn Pane, MD   80 mg at 09/06/11 1713  . clopidogrel (PLAVIX) tablet 75 mg  75 mg Oral Q breakfast Robynn Pane, MD   75 mg at 09/07/11 0816  . famotidine (PEPCID) tablet 20 mg  20 mg Oral BID Robynn Pane, MD   20 mg at 09/07/11 1102  . Fluticasone-Salmeterol (ADVAIR) 250-50 MCG/DOSE inhaler 1 puff  1 puff Inhalation BID Robynn Pane, MD   1 puff at 09/07/11 0851  . glimepiride (AMARYL) tablet 2 mg  2 mg Oral Q breakfast Gwenyth Bender, MD      . insulin aspart (novoLOG) injection 0-15 Units  0-15 Units Subcutaneous TID WC Robynn Pane, MD   3 Units at 09/07/11 [phone]  . levalbuterol (XOPENEX) nebulizer solution 1.25 mg  1.25 mg Nebulization TID Robynn Pane, MD   1.25 mg at 09/07/11 1515  . levalbuterol (XOPENEX) nebulizer solution 1.25 mg  1.25 mg Nebulization Q4H PRN Robynn Pane, MD   1.25 mg at 09/01/11 0248  . nitroGLYCERIN (NITROSTAT) SL  tablet 0.4 mg  0.4 mg Sublingual Q5 Min x 3 PRN Robynn Pane, MD      . ondansetron (ZOFRAN) injection 4 mg  4 mg Intravenous Q6H PRN Robynn Pane, MD      . predniSONE (STERAPRED UNI-PAK) tablet 5 mg  5 mg Oral 4X daily taper Robynn Pane, MD   5 mg at 09/07/11 1354  . tiotropium (SPIRIVA) inhalation capsule 18 mcg  18 mcg Inhalation Daily Robynn Pane, MD   18 mcg at 09/07/11 0858  . DISCONTD: glimepiride (AMARYL) tablet 4 mg  4 mg Oral Q breakfast Robynn Pane, MD   4 mg at 09/07/11 0816    Objective: Blood pressure 132/81, pulse 71, temperature 98 F (36.7 C), temperature source Oral, resp. rate 18, height 5\' 8"  (1.727 m), weight 178 lb 12.7 oz (81.1 kg), SpO2 97.00%.  WDWN White male in no acute distress. HEENT:no sinus tenderness. NECK:no posterior cervical nodes. LUNGS:distant breath sounds. No wheezes or vocal fremitus. JX:BJYNWG S1, S2 without S3. ABD:no tenderness. MSK:no edema. Negative homan's. NEURO:nonfocal presently.  Lab results: Results for orders placed during the hospital encounter of 08/31/11 (from the past 48 hour(s))  GLUCOSE, CAPILLARY     Status: Abnormal   Collection  Time   09/05/11  4:42 PM      Component Value Range Comment   Glucose-Capillary 108 (*) 70 - 99 (mg/dL)    Comment 1 Notify RN     GLUCOSE, CAPILLARY     Status: Abnormal   Collection Time   09/05/11  8:59 PM      Component Value Range Comment   Glucose-Capillary 175 (*) 70 - 99 (mg/dL)    Comment 1 Notify RN     GLUCOSE, CAPILLARY     Status: Abnormal   Collection Time   09/06/11  7:51 AM      Component Value Range Comment   Glucose-Capillary 142 (*) 70 - 99 (mg/dL)    Comment 1 Notify RN     GLUCOSE, CAPILLARY     Status: Normal   Collection Time   09/06/11 11:31 AM      Component Value Range Comment   Glucose-Capillary 96  70 - 99 (mg/dL)    Comment 1 Notify RN     GLUCOSE, CAPILLARY     Status: Abnormal   Collection Time   09/06/11  4:58 PM      Component Value Range  Comment   Glucose-Capillary 180 (*) 70 - 99 (mg/dL)    Comment 1 Notify RN     GLUCOSE, CAPILLARY     Status: Abnormal   Collection Time   09/06/11  8:51 PM      Component Value Range Comment   Glucose-Capillary 177 (*) 70 - 99 (mg/dL)   GLUCOSE, CAPILLARY     Status: Abnormal   Collection Time   09/07/11  7:32 AM      Component Value Range Comment   Glucose-Capillary 170 (*) 70 - 99 (mg/dL)    Comment 1 Notify RN     GLUCOSE, CAPILLARY     Status: Abnormal   Collection Time   09/07/11 11:54 AM      Component Value Range Comment   Glucose-Capillary 69 (*) 70 - 99 (mg/dL)    Comment 1 Notify RN       Studies/Results: No results found.  Patient Active Problem List  Diagnoses  . Acute coronary syndrome    Impression: Hypoglycemia. Patient on amaryl. COPD with respiratory failure. No worsening. Acute coronary syndrome. Unstable home situation. Diabetes Mellitus.   Plan: Reduce his amaryl. Monitor CBG's today. Follow up.   August Saucer, Jenson Beedle 09/07/2011 3:56 PM

## 2011-09-08 LAB — GLUCOSE, CAPILLARY

## 2011-09-08 MED ORDER — TIOTROPIUM BROMIDE MONOHYDRATE 18 MCG IN CAPS: 18.0000 ug | ORAL_CAPSULE | Freq: Every day | RESPIRATORY_TRACT | Status: DC

## 2011-09-08 MED ORDER — FLUTICASONE-SALMETEROL 250-50 MCG/DOSE IN AEPB: 1.0000 | INHALATION_SPRAY | Freq: Two times a day (BID) | RESPIRATORY_TRACT | Status: DC

## 2011-09-08 MED ORDER — ATORVASTATIN CALCIUM 80 MG PO TABS: 40.0000 mg | ORAL_TABLET | Freq: Every day | ORAL | Status: DC

## 2011-09-08 MED ORDER — LOSARTAN POTASSIUM 50 MG PO TABS: 50.0000 mg | ORAL_TABLET | Freq: Every day | ORAL | Status: DC

## 2011-09-08 MED ORDER — PREDNISONE (PAK) 5 MG PO TABS: 5.0000 mg | ORAL_TABLET | Freq: Four times a day (QID) | ORAL | Status: DC

## 2011-09-08 NOTE — Progress Notes (Signed)
Assisted with pt dc to [facility]. CSW provided pt with bus passess on Friday 5/24 and csw confirmed with patient that he still had bus passes.CSW confirmed with facility that patient can accept pt. .No further Clinical Social Work needs, signing off.   Catha Gosselin, Theresia Majors  [phone] .09/08/2011 1021

## 2011-09-08 NOTE — Discharge Summary (Signed)
  Discharge summary dictated on 09/08/2011 dictation number is [phone]

## 2011-09-08 NOTE — Discharge Summary (Signed)
NAMELAURI, TILL NO.:  [id number]  MEDICAL RECORD NO.:  [id number]  LOCATION:  3709                         FACILITY:  [facility]  PHYSICIAN:  Eduardo Osier. Sharyn Lull, M.D. DATE OF BIRTH:  1940/09/17  DATE OF ADMISSION:  08/31/2011 DATE OF DISCHARGE:  09/08/2011                              DISCHARGE SUMMARY   ADMITTING DIAGNOSES: 1. Acute coronary syndrome, rule out myocardial infarction. 2. Exacerbation of chronic obstructive pulmonary disease. 3. Hypertension. 4. Non-insulin-dependent diabetes mellitus. 5. History of tobacco abuse. 6. History of alcohol abuse. 7. Positive family history of coronary artery disease.  FINAL DIAGNOSES: 1. Status post chest pain, myocardial infarction, ruled out.  Mild     coronary artery disease, status post left catheterization, status     post exacerbation of chronic obstructive pulmonary disease. 2. Hypertension. 3. Non-insulin-dependent diabetes mellitus. 4. History of tobacco abuse. 5. History of alcohol abuse. 6. Positive family history of coronary artery disease. 7. Hypercholesteremia.  DISCHARGE HOME MEDICATIONS: 1. Atorvastatin 40 mg daily. 2. Advair 250/50 one puff twice daily. 3. Prednisone taper as per discharge sheet. 4. Spiriva 18 mcg 1 inhalation daily. 5. Losartan 50 mg 1 tablet daily. 6. Enteric-coated aspirin 81 mg 1 tablet daily. 7. Amaryl 2 mg 1 tablet daily.  DIET:  Low-salt, low-cholesterol, 1800 calories ADA diet.  The patient has been advised to monitor blood pressure and blood sugar daily.  FOLLOWUP:  With me in 1 week.  CONDITION AT DISCHARGE:  Stable.  BRIEF HISTORY AND HOSPITAL COURSE:  The patient is a 71 year old white male with past medical history significant for hypertension, non-insulin- dependent diabetes mellitus, COPD, hypercholesteremia, history of tobacco abuse, history of alcohol abuse, positive family history of coronary artery disease.  He came to the ER complaining of  recurrent retrosternal chest pain described as burning/heaviness radiating across the chest associated with shortness of breath and dizziness while [place] the highway, off and on since this morning.  The patient received 4 baby aspirin and sublingual nitro with partial relief of chest pain in the ER and was noted to be in sinus tach and hypotensive requiring fluid challenge.  EKG done in the ER showed sinus tachycardia with no acute ischemic changes.  The patient states he has been walking since yesterday on his way to [facility] when he developed exertional chest pain, relieved initially with rest, but again had recurrent chest pain as he continued to walk.  Denies any nausea, vomiting, diaphoresis. Denies palpitation, lightheadedness or syncope, but felt dizzy.  Denies any relation of chest pain to food, breathing or movement.  Denies any cardiac workup in the recent past.  PHYSICAL EXAMINATION:  VITAL SIGNS:  His blood pressure was 111/64, pulse was 73 after fluid challenge.  He was afebrile. EYES:  Conjunctivae were pink. NECK:  Supple.  No JVD.  No bruit. LUNGS:  He had decreased breath sounds at bases with bilateral expiratory wheezing. CARDIOVASCULAR:  S1, S2 was normal.  There was soft systolic murmur.  No S3 gallop. ABDOMEN:  Soft.  Bowel sounds were present.  Nontender. EXTREMITIES:  There is no clubbing, cyanosis or edema.  LABORATORY DATA:  Sodium was 141, potassium 3.4, glucose was 157,  BUN 14, creatinine 0.96.  Three sets of cardiac enzymes were negative. Cholesterol was 133, triglycerides were 166, LDL was 67, HDL 33, hemoglobin was 15.7, hematocrit 46.1, white count of 10.0.  His hemoglobin A1c was 7.1.  TSH was 2.17 which was in normal range.  Repeat EKG showed normal sinus rhythm with no acute ischemic changes.  BRIEF HOSPITAL COURSE:  The patient was admitted to telemetry unit.  MI was ruled out by serial enzymes and EKG.  The patient subsequently underwent left  cardiac cath with selective left and right coronary angiography.  As per procedure report, the patient tolerated the procedure well.  There were no complications.  Postprocedure, the patient did not had any anginal chest pain.  Social service consultation was obtained as the patient has no place to stay.  The patient had also episode of further wheezing requiring IV Solu-Medrol which was switched to prednisone Dosepak taper.  The patient had bed offer at [facility] and will be discharged today, and will be followed up in my office in 1 week.     Eduardo Osier. Sharyn Lull, M.D.     [facility]MED[facility]  D:  09/08/2011  T:  09/08/2011  Job:  [id number]

## 2011-09-08 NOTE — Discharge Instructions (Signed)
Chest Pain (Nonspecific) Chest pain has many causes. Your pain could be caused by something serious, such as a heart attack or a blood clot in the lungs. It could also be caused by something less serious, such as a chest bruise or a virus. Follow up with your doctor. More lab tests or other studies may be needed to find the cause of your pain. Most of the time, nonspecific chest pain will improve within 2 to 3 days of rest and mild pain medicine. HOME CARE  For chest bruises, you may put ice on the sore area for 15 to 20 minutes, 3 to 4 times a day. Do this only if it makes you or your child feel better.   Put ice in a plastic bag.   Place a towel between the skin and the bag.   Rest for the next 2 to 3 days.   Go back to work if the pain improves.   See your doctor if the pain lasts longer than 1 to 2 weeks.   Only take medicine as told by your doctor.   Quit smoking if you smoke.  GET HELP RIGHT AWAY IF:   There is more pain or pain that spreads to the arm, neck, jaw, back, or belly (abdomen).   You or your child has shortness of breath.   You or your child coughs more than usual or coughs up blood.   You or your child has very bad back or belly pain, feels sick to his or her stomach (nauseous), or throws up (vomits).   You or your child has very bad weakness.   You or your child passes out (faints).   You or your child has a temperature by mouth above 102 F (38.9 C), not controlled by medicine.  Any of these problems may be serious and may be an emergency. Do not wait to see if the problems will go away. Get medical help right away. Call your local emergency services 911 in [place] Do not drive yourself to the hospital. MAKE SURE YOU:   Understand these instructions.   Will watch this condition.   Will get help right away if you or your child is not doing well or gets worse.  Document Released: 09/17/2007 Document Revised: 03/20/2011 Document Reviewed:  09/17/2007 [facility] Patient Information 2012 [place], [place].Chronic Obstructive Pulmonary Disease Chronic obstructive pulmonary disease (COPD) is a condition in which airflow from the lungs is restricted. The lungs can never return to normal, but there are measures you can take which will improve them and make you feel better. CAUSES   Smoking.   Exposure to secondhand smoke.   Breathing in irritants (pollution, cigarette smoke, strong smells, aerosol sprays, paint fumes).   History of lung infections.  TREATMENT  Treatment focuses on making you comfortable (supportive care). Your caregiver may prescribe medications (inhaled or pills) to help improve your breathing. HOME CARE INSTRUCTIONS   If you smoke, stop smoking.   Avoid exposure to smoke, chemicals, and fumes that aggravate your breathing.   Take antibiotic medicines as directed by your caregiver.   Avoid medicines that dry up your system and slow down the elimination of secretions (antihistamines and cough syrups). This decreases respiratory capacity and may lead to infections.   Drink enough water and fluids to keep your urine clear or pale yellow. This loosens secretions.   Use humidifiers at home and at your bedside if they do not make breathing difficult.   Receive all protective vaccines  your caregiver suggests, especially pneumococcal and influenza.   Use home oxygen as suggested.   Stay active. Exercise and physical activity will help maintain your ability to do things you want to do.   Eat a healthy diet.  SEEK MEDICAL CARE IF:   You develop pus-like mucus (sputum).   Breathing is more labored or exercise becomes difficult to do.   You are running out of the medicine you take for your breathing.  SEEK IMMEDIATE MEDICAL CARE IF:   You have a rapid heart rate.   You have agitation, confusion, tremors, or are in a stupor (family members may need to observe this).   It becomes difficult to breathe.   You  develop chest pain.   You have a fever.  MAKE SURE YOU:   Understand these instructions.   Will watch your condition.   Will get help right away if you are not doing well or get worse.  Document Released: 01/08/2005 Document Revised: 03/20/2011 Document Reviewed: 05/31/2010 [facility] Patient Information 2012 [place], [place].Chest Pain (Nonspecific) Chest pain has many causes. Your pain could be caused by something serious, such as a heart attack or a blood clot in the lungs. It could also be caused by something less serious, such as a chest bruise or a virus. Follow up with your doctor. More lab tests or other studies may be needed to find the cause of your pain. Most of the time, nonspecific chest pain will improve within 2 to 3 days of rest and mild pain medicine. HOME CARE  For chest bruises, you may put ice on the sore area for 15 to 20 minutes, 3 to 4 times a day. Do this only if it makes you or your child feel better.   Put ice in a plastic bag.   Place a towel between the skin and the bag.   Rest for the next 2 to 3 days.   Go back to work if the pain improves.   See your doctor if the pain lasts longer than 1 to 2 weeks.   Only take medicine as told by your doctor.   Quit smoking if you smoke.  GET HELP RIGHT AWAY IF:   There is more pain or pain that spreads to the arm, neck, jaw, back, or belly (abdomen).   You or your child has shortness of breath.   You or your child coughs more than usual or coughs up blood.   You or your child has very bad back or belly pain, feels sick to his or her stomach (nauseous), or throws up (vomits).   You or your child has very bad weakness.   You or your child passes out (faints).   You or your child has a temperature by mouth above 102 F (38.9 C), not controlled by medicine.  Any of these problems may be serious and may be an emergency. Do not wait to see if the problems will go away. Get medical help right away. Call your local  emergency services 911 in [place] Do not drive yourself to the hospital. MAKE SURE YOU:   Understand these instructions.   Will watch this condition.   Will get help right away if you or your child is not doing well or gets worse.  Document Released: 09/17/2007 Document Revised: 03/20/2011 Document Reviewed: 09/17/2007 [facility] Patient Information 2012 [place], [place].

## 2011-09-08 NOTE — Progress Notes (Signed)
Pt provided with dc education and precriptions. Pt verbalized understanding. Pt provided with spiriva/advair inhalers for home as well as last 2 pills in prednisone taper pack. Pt understands how to tak all medications and importance of taking meds as prescribed. Pt verbalized understandng to follow up with Dr. Sharyn Lull in 1 week. Pt is eating lunch and then leaving with bus passes in hand for [employer]. IV removed with tip intact. Heart monitor cleaned and returned to front. No questions at this time. Will continue to monitor until patient leaves floor. Ramond Craver, Rn

## 2011-09-08 NOTE — Progress Notes (Signed)
CBG: 60  Treatment: 15 GM carbohydrate snack  Symptoms: None  Follow-up CBG: Time:1235 CBG Result:157  Possible Reasons for Event: Pt doesn't take insulin at home. Pt received insulin this AM and is no longer on IV solumedrol.  Comments/MD notified: Dr. Sharyn Lull Paged.    Ramond Craver Lauren

## 2011-09-23 ENCOUNTER — Emergency Department (HOSPITAL_COMMUNITY): Payer: Medicare Other

## 2011-09-23 ENCOUNTER — Encounter (HOSPITAL_COMMUNITY): Payer: Self-pay | Admitting: Emergency Medicine

## 2011-09-23 ENCOUNTER — Inpatient Hospital Stay (HOSPITAL_COMMUNITY)
Admission: EM | Admit: 2011-09-23 | Discharge: 2011-09-30 | DRG: 192 | Disposition: A | Discharge status: Home / Self Care | Payer: Medicare Other | Source: Ambulatory Visit | Attending: Cardiology | Admitting: Cardiology

## 2011-09-23 ENCOUNTER — Other Ambulatory Visit: Payer: Self-pay

## 2011-09-23 DIAGNOSIS — I10 Essential (primary) hypertension: Secondary | ICD-10-CM | POA: Diagnosis present

## 2011-09-23 DIAGNOSIS — I2510 Atherosclerotic heart disease of native coronary artery without angina pectoris: Secondary | ICD-10-CM | POA: Diagnosis present

## 2011-09-23 DIAGNOSIS — J441 Chronic obstructive pulmonary disease with (acute) exacerbation: Secondary | ICD-10-CM

## 2011-09-23 DIAGNOSIS — Z5900 Homelessness unspecified: Secondary | ICD-10-CM

## 2011-09-23 DIAGNOSIS — E7800 Pure hypercholesterolemia, unspecified: Secondary | ICD-10-CM | POA: Diagnosis present

## 2011-09-23 DIAGNOSIS — Z87891 Personal history of nicotine dependence: Secondary | ICD-10-CM

## 2011-09-23 DIAGNOSIS — E119 Type 2 diabetes mellitus without complications: Secondary | ICD-10-CM | POA: Diagnosis present

## 2011-09-23 HISTORY — DX: Atherosclerotic heart disease of native coronary artery without angina pectoris: I25.10

## 2011-09-23 HISTORY — DX: Unspecified osteoarthritis, unspecified site: M19.90

## 2011-09-23 HISTORY — DX: Chest pain, unspecified: R07.9

## 2011-09-23 HISTORY — DX: Shortness of breath: R06.02

## 2011-09-23 LAB — DIFFERENTIAL
Basophils Absolute: 0 10*3/uL (ref 0.0–0.1)
Basophils Relative: 0 % (ref 0–1)
Eosinophils Absolute: 0 10*3/uL (ref 0.0–0.7)
Eosinophils Relative: 2 % (ref 0–5)
Lymphocytes Relative: 24 % (ref 12–46)
Lymphs Abs: 2.9 10*3/uL (ref 0.7–4.0)
Monocytes Absolute: 0.8 10*3/uL (ref 0.1–1.0)
Monocytes Relative: 6 % (ref 3–12)
Monocytes Relative: 0 % — ABNORMAL LOW (ref 3–12)
Neutro Abs: 10.4 10*3/uL — ABNORMAL HIGH (ref 1.7–7.7)
Neutrophils Relative %: 96 % — ABNORMAL HIGH (ref 43–77)

## 2011-09-23 LAB — EXPECTORATED SPUTUM ASSESSMENT W GRAM STAIN, RFLX TO RESP C

## 2011-09-23 LAB — CBC
HCT: 49.7 % (ref 39.0–52.0)
MCH: 30 pg (ref 26.0–34.0)
MCHC: 33.5 g/dL (ref 30.0–36.0)
MCV: 88.6 fL (ref 78.0–100.0)
Platelets: 139 10*3/uL — ABNORMAL LOW (ref 150–400)
RBC: 5.61 MIL/uL (ref 4.22–5.81)
RDW: 14.8 % (ref 11.5–15.5)
WBC: 12.4 10*3/uL — ABNORMAL HIGH (ref 4.0–10.5)

## 2011-09-23 LAB — COMPREHENSIVE METABOLIC PANEL
BUN: 15 mg/dL (ref 6–23)
CO2: 22 mEq/L (ref 19–32)
Calcium: 9.6 mg/dL (ref 8.4–10.5)
Chloride: 100 mEq/L (ref 96–112)
Creatinine, Ser: 0.77 mg/dL (ref 0.50–1.35)
GFR calc Af Amer: >90 mL/min (ref >90)
GFR calc non Af Amer: 89 mL/min — ABNORMAL LOW (ref >90)
Glucose, Bld: 352 mg/dL — ABNORMAL HIGH (ref 70–99)
Total Bilirubin: 0.7 mg/dL (ref 0.3–1.2)

## 2011-09-23 LAB — BASIC METABOLIC PANEL
BUN: 13 mg/dL (ref 6–23)
CO2: 28 mEq/L (ref 19–32)
Calcium: 9.7 mg/dL (ref 8.4–10.5)
Creatinine, Ser: 0.74 mg/dL (ref 0.50–1.35)
Glucose, Bld: 169 mg/dL — ABNORMAL HIGH (ref 70–99)

## 2011-09-23 LAB — GLUCOSE, CAPILLARY
Glucose-Capillary: 313 mg/dL — ABNORMAL HIGH (ref 70–99)
Glucose-Capillary: 285 mg/dL — ABNORMAL HIGH (ref 70–99)

## 2011-09-23 MED ORDER — POTASSIUM CHLORIDE CRYS ER 20 MEQ PO TBCR
40.0000 meq | EXTENDED_RELEASE_TABLET | Freq: Once | ORAL | Status: AC
  Administered 2011-09-23: 40 meq via ORAL

## 2011-09-23 MED ORDER — ALBUTEROL SULFATE (5 MG/ML) 0.5% IN NEBU
INHALATION_SOLUTION | RESPIRATORY_TRACT | Status: AC
  Administered 2011-09-23: 2.5 mg via RESPIRATORY_TRACT
  Filled 2011-09-23: qty 0.5

## 2011-09-23 MED ORDER — METHYLPREDNISOLONE SODIUM SUCC 125 MG IJ SOLR
60.0000 mg | Freq: Four times a day (QID) | INTRAMUSCULAR | Status: DC
  Administered 2011-09-23 – 2011-09-25 (×7): 60 mg via INTRAVENOUS
  Filled 2011-09-23 (×3): qty 0.96
  Filled 2011-09-23: qty 2
  Filled 2011-09-23 (×3): qty 0.96
  Filled 2011-09-23: qty 2
  Filled 2011-09-23 (×4): qty 0.96

## 2011-09-23 MED ORDER — SODIUM CHLORIDE 0.9 % IV BOLUS (SEPSIS)
500.0000 mL | Freq: Once | INTRAVENOUS | Status: AC
  Administered 2011-09-23: 500 mL via INTRAVENOUS

## 2011-09-23 MED ORDER — MAGNESIUM HYDROXIDE 400 MG/5ML PO SUSP
30.0000 mL | Freq: Every day | ORAL | Status: DC | PRN
  Administered 2011-09-23: 30 mL via ORAL
  Filled 2011-09-23: qty 30

## 2011-09-23 MED ORDER — ALBUTEROL SULFATE (5 MG/ML) 0.5% IN NEBU
10.0000 mg | INHALATION_SOLUTION | Freq: Once | RESPIRATORY_TRACT | Status: AC
  Administered 2011-09-23: 10 mg via RESPIRATORY_TRACT

## 2011-09-23 MED ORDER — HEPARIN SODIUM (PORCINE) 5000 UNIT/ML IJ SOLN
5000.0000 [IU] | Freq: Three times a day (TID) | INTRAMUSCULAR | Status: DC
  Administered 2011-09-23 – 2011-09-30 (×20): 5000 [IU] via SUBCUTANEOUS
  Filled 2011-09-23 (×24): qty 1

## 2011-09-23 MED ORDER — IPRATROPIUM BROMIDE 0.02 % IN SOLN
RESPIRATORY_TRACT | Status: AC
  Filled 2011-09-23: qty 2.5

## 2011-09-23 MED ORDER — DEXTROSE 5 % IV SOLN
1.0000 g | INTRAVENOUS | Status: DC
  Administered 2011-09-23 – 2011-09-29 (×7): 1 g via INTRAVENOUS
  Filled 2011-09-23 (×8): qty 10

## 2011-09-23 MED ORDER — SODIUM CHLORIDE 0.9 % IJ SOLN
3.0000 mL | Freq: Two times a day (BID) | INTRAMUSCULAR | Status: DC
  Administered 2011-09-23 – 2011-09-29 (×10): 3 mL via INTRAVENOUS

## 2011-09-23 MED ORDER — METHYLPREDNISOLONE SODIUM SUCC 125 MG IJ SOLR
INTRAMUSCULAR | Status: AC
  Administered 2011-09-23: 09:00:00
  Filled 2011-09-23: qty 2

## 2011-09-23 MED ORDER — BIOTENE DRY MOUTH MT LIQD
15.0000 mL | Freq: Two times a day (BID) | OROMUCOSAL | Status: DC
  Administered 2011-09-24 – 2011-09-30 (×9): 15 mL via OROMUCOSAL

## 2011-09-23 MED ORDER — GLIMEPIRIDE 2 MG PO TABS
2.0000 mg | ORAL_TABLET | Freq: Every day | ORAL | Status: DC
  Administered 2011-09-24 – 2011-09-30 (×7): 2 mg via ORAL
  Filled 2011-09-23 (×8): qty 1

## 2011-09-23 MED ORDER — ALBUTEROL (5 MG/ML) CONTINUOUS INHALATION SOLN
INHALATION_SOLUTION | RESPIRATORY_TRACT | Status: AC
  Filled 2011-09-23: qty 20

## 2011-09-23 MED ORDER — ALBUTEROL SULFATE (5 MG/ML) 0.5% IN NEBU
INHALATION_SOLUTION | RESPIRATORY_TRACT | Status: AC
  Filled 2011-09-23: qty 2

## 2011-09-23 MED ORDER — LEVALBUTEROL HCL 1.25 MG/0.5ML IN NEBU
1.2500 mg | INHALATION_SOLUTION | RESPIRATORY_TRACT | Status: DC | PRN
Start: 2011-09-23 — End: 2011-09-30
  Administered 2011-09-24 – 2011-09-28 (×3): 1.25 mg via RESPIRATORY_TRACT
  Filled 2011-09-23 (×2): qty 0.5

## 2011-09-23 MED ORDER — ALBUTEROL SULFATE (5 MG/ML) 0.5% IN NEBU
INHALATION_SOLUTION | RESPIRATORY_TRACT | Status: AC
  Administered 2011-09-23: 09:00:00
  Filled 2011-09-23: qty 2

## 2011-09-23 MED ORDER — IPRATROPIUM BROMIDE 0.02 % IN SOLN
RESPIRATORY_TRACT | Status: AC
  Administered 2011-09-23: 09:00:00
  Filled 2011-09-23: qty 2.5

## 2011-09-23 MED ORDER — PANTOPRAZOLE SODIUM 40 MG PO TBEC
40.0000 mg | DELAYED_RELEASE_TABLET | Freq: Every day | ORAL | Status: DC
  Administered 2011-09-23 – 2011-09-30 (×7): 40 mg via ORAL
  Filled 2011-09-23 (×7): qty 1

## 2011-09-23 MED ORDER — ASPIRIN EC 81 MG PO TBEC
81.0000 mg | DELAYED_RELEASE_TABLET | Freq: Every day | ORAL | Status: DC
  Administered 2011-09-24 – 2011-09-30 (×7): 81 mg via ORAL
  Filled 2011-09-23 (×7): qty 1

## 2011-09-23 MED ORDER — POTASSIUM CHLORIDE CRYS ER 20 MEQ PO TBCR
EXTENDED_RELEASE_TABLET | ORAL | Status: AC
  Filled 2011-09-23: qty 2

## 2011-09-23 MED ORDER — LEVALBUTEROL HCL 1.25 MG/0.5ML IN NEBU
1.2500 mg | INHALATION_SOLUTION | Freq: Four times a day (QID) | RESPIRATORY_TRACT | Status: DC
  Administered 2011-09-23 – 2011-09-28 (×21): 1.25 mg via RESPIRATORY_TRACT
  Filled 2011-09-23 (×25): qty 0.5

## 2011-09-23 MED ORDER — ASPIRIN EC 81 MG PO TBEC: 81.0000 mg | DELAYED_RELEASE_TABLET | Freq: Every day | ORAL | Status: DC

## 2011-09-23 MED ORDER — ATORVASTATIN CALCIUM 80 MG PO TABS
80.0000 mg | ORAL_TABLET | Freq: Every day | ORAL | Status: DC
  Administered 2011-09-23 – 2011-09-30 (×8): 80 mg via ORAL
  Filled 2011-09-23 (×8): qty 1

## 2011-09-23 MED ORDER — PREDNISONE 20 MG PO TABS
60.0000 mg | ORAL_TABLET | Freq: Once | ORAL | Status: AC
  Administered 2011-09-23: 60 mg via ORAL
  Filled 2011-09-23: qty 3

## 2011-09-23 MED ORDER — INSULIN ASPART 100 UNIT/ML ~~LOC~~ SOLN
0.0000 [IU] | Freq: Three times a day (TID) | SUBCUTANEOUS | Status: DC
  Administered 2011-09-23: 7 [IU] via SUBCUTANEOUS
  Administered 2011-09-24 (×2): 5 [IU] via SUBCUTANEOUS
  Administered 2011-09-24 – 2011-09-25 (×2): 3 [IU] via SUBCUTANEOUS
  Administered 2011-09-25: 13:00:00 via SUBCUTANEOUS
  Administered 2011-09-25: 2 [IU] via SUBCUTANEOUS
  Administered 2011-09-26: 5 [IU] via SUBCUTANEOUS
  Administered 2011-09-26 (×2): 3 [IU] via SUBCUTANEOUS
  Administered 2011-09-27: 2 [IU] via SUBCUTANEOUS
  Administered 2011-09-27: 7 [IU] via SUBCUTANEOUS
  Administered 2011-09-27: 1 [IU] via SUBCUTANEOUS
  Administered 2011-09-28: 18:00:00 via SUBCUTANEOUS
  Administered 2011-09-28: 2 [IU] via SUBCUTANEOUS
  Administered 2011-09-29 – 2011-09-30 (×3): 1 [IU] via SUBCUTANEOUS

## 2011-09-23 MED ORDER — ALBUTEROL SULFATE (5 MG/ML) 0.5% IN NEBU
2.5000 mg | INHALATION_SOLUTION | RESPIRATORY_TRACT | Status: DC
  Administered 2011-09-23: 2.5 mg via RESPIRATORY_TRACT

## 2011-09-23 MED ORDER — FLUTICASONE-SALMETEROL 250-50 MCG/DOSE IN AEPB
1.0000 | INHALATION_SPRAY | Freq: Two times a day (BID) | RESPIRATORY_TRACT | Status: DC
  Administered 2011-09-23: 1 via RESPIRATORY_TRACT
  Filled 2011-09-23 (×2): qty 14

## 2011-09-23 MED ORDER — SODIUM CHLORIDE 0.9 % IV SOLN
INTRAVENOUS | Status: DC
  Administered 2011-09-23 – 2011-09-27 (×4): via INTRAVENOUS

## 2011-09-23 NOTE — ED Provider Notes (Signed)
History     CSN: [id number]  Arrival date & time 09/23/11  0844   First MD Initiated Contact with Patient 09/23/11 0845      Chief Complaint  Patient presents with  . Shortness of Breath    (Consider location/radiation/quality/duration/timing/severity/associated sxs/prior treatment) HPI  Patient is brought to ER by EMS from shelter with complaints of a 24 hour hx of gradual onset SOB with patient stating he has run out of his albuterol and hx has of COPD and asthma. Patient states that he still "has a little advair but not much." Patient was hospitalized recently for evaluation of SOB with associated CP and had MI ruled out. Patient denies any associated CP, fevers, chills, abdominal pain, n/v. Patient states that he had gradual onset productive cough but denies hemoptysis. Denies lower extremity pain or swelling. SOB aggravated by exertion.    Abbreviated d/c summary by Dr. Sharyn Lull.  BRIEF HOSPITAL COURSE: The patient was admitted to telemetry unit. MI was ruled out by serial enzymes and EKG. The patient subsequently underwent left cardiac cath with selective left and right coronary angiography. As per procedure report, the patient tolerated the procedure well. There were no complications. Postprocedure, the patient did not had any anginal chest pain. Social service consultation was obtained as the patient has no place to stay. The patient had also  episode of further wheezing requiring IV Solu-Medrol which was switched to prednisone Dosepak taper. The patient had bed offer at [facility] and will be discharged today, and will be followed up in my office in 1  week.  Eduardo Osier. Sharyn Lull, M.D.   Past Medical History  Diagnosis Date  . Diabetes mellitus   . Hypertension   . COPD (chronic obstructive pulmonary disease)     History reviewed. No pertinent past surgical history.  History reviewed. No pertinent family history.  History  Substance Use Topics  . Smoking status: Current  Everyday Smoker    Types: Cigarettes  . Smokeless tobacco: Not on file  . Alcohol Use: Yes     history of ETOH in  past--denies current use      Review of Systems  All other systems reviewed and are negative.    Allergies  Review of patient's allergies indicates no known allergies.  Home Medications   Current Outpatient Rx  Name Route Sig Dispense Refill  . ATORVASTATIN CALCIUM 80 MG PO TABS Oral Take 40 mg by mouth every evening.    Marland Kitchen FLUTICASONE-SALMETEROL 250-50 MCG/DOSE IN AEPB Inhalation Inhale 1 puff into the lungs every 12 (twelve) hours.    Marland Kitchen TIOTROPIUM BROMIDE MONOHYDRATE 18 MCG IN CAPS Inhalation Place 18 mcg into inhaler and inhale daily.    . ASPIRIN EC 81 MG PO TBEC Oral Take 81 mg by mouth daily.    Marland Kitchen GLIMEPIRIDE 2 MG PO TABS Oral Take 2 mg by mouth daily before breakfast.    . LOSARTAN POTASSIUM 50 MG PO TABS Oral Take 1 tablet (50 mg total) by mouth daily. 30 tablet 3  . PREDNISONE (PAK) 5 MG PO TABS Oral Take 1 tablet (5 mg total) by mouth taper from 4 doses each day to 1 dose and stop. 20 tablet 0    There were no vitals taken for this visit.  Physical Exam  Nursing note and vitals reviewed. Constitutional: He is oriented to person, place, and time. He appears well-developed and well-nourished. No distress.  HENT:  Head: Normocephalic and atraumatic.  Eyes: Conjunctivae are normal.  Neck: Normal  range of motion. Neck supple.  Cardiovascular: Normal rate, regular rhythm, normal heart sounds and intact distal pulses.  Exam reveals no gallop and no friction rub.   No murmur heard. Pulmonary/Chest: Effort normal. No respiratory distress. He has wheezes. He has no rales. He exhibits no tenderness.       Tachypneic, diffuse wheezing.   Abdominal: Soft. Bowel sounds are normal. He exhibits no distension and no mass. There is no tenderness. There is no rebound and no guarding.  Musculoskeletal: Normal range of motion. He exhibits no edema and no tenderness.    Neurological: He is alert and oriented to person, place, and time.  Skin: Skin is warm and dry. No rash noted. He is not diaphoretic. No erythema.  Psychiatric: He has a normal mood and affect.    ED Course  Procedures (including critical care time)  Patient receiving neb albuterol started by EMS.  PO prednisone and IV fluids.   Patient evaluated at bedside by Dr. Adriana Simas  Patient remains tachypneic. Will speak with Dr. Sharyn Lull about readmission given ongoing SOB and tachynpea and concern for poor    Date: 09/23/2011  Rate: 90  Rhythm: normal sinus rhythm  QRS Axis: normal  Intervals: normal  ST/T Wave abnormalities: normal  Conduction Disutrbances:none  Narrative Interpretation: non provocative EKG compared to Sep 02, 2011  Old EKG Reviewed: unchanged    Labs Reviewed  CBC  DIFFERENTIAL  BASIC METABOLIC PANEL   Dg Chest Portable 1 View  09/23/2011  *RADIOLOGY REPORT*  Clinical Data: Shortness of breath, difficulty breathing, similar episode 2 weeks ago with chest pain, history smoking, hypertension, diabetes  PORTABLE CHEST - 1 VIEW  Comparison: Portable exam 0917 hours compared to 08/31/2011  Findings: Normal heart size, mediastinal contours, and pulmonary vascularity. Lungs clear. No pleural effusion or pneumothorax. No acute osseous findings.  IMPRESSION: No acute abnormalities.  Original Report Authenticated By: Lollie Marrow, M.D.     1. COPD exacerbation       MDM  Dr. Sharyn Lull to admit for COPD exacerbation. VS, improving breathing with decreased work to breath after CAT.          [place], [place] 09/23/11 1126

## 2011-09-23 NOTE — ED Notes (Signed)
Pt brought in via EMS from shelter, for SOB. Pt states he has been out of his albuterol for a few days. Does not have PCP in town. Pt given albuterol neb tx x1 and duo neb x1 in route to ED, 125mg  solu-medrol IV given by EMS.

## 2011-09-23 NOTE — ED Notes (Signed)
[place] Sandwich, apple sauce, and water given to patient. Patient resting comfortably on stretcher states feels better now then first entered the ED.

## 2011-09-23 NOTE — ED Notes (Signed)
EKG done at 09:15 and shown to Dr. Adriana Simas. An old EKG was pulled from the muse system and also given to the doctor.

## 2011-09-23 NOTE — ED Notes (Signed)
Pt denies pain. Breathing still labored after 2 neb treatments and solu-medrol. Respiratory tech notified. Xray tech in room now for CXR

## 2011-09-23 NOTE — ED Notes (Signed)
Respiratory in room at this time.

## 2011-09-23 NOTE — H&P (Signed)
Jeremiah Burnett is an 71 y.o. male.   Chief Complaint: Progressive shortness of breath/coughing HPI: Patient is 71 year old male with past medical history significant for mild coronary artery disease COPD, hypertension, non-insulin-dependent diabetes mellitus, history of tobacco abuse, history of focal abuse, hypercholesteremia, positive family history of coronary artery disease, recently discharged from the hospital in May of 2013 came to the ER by EMS her complaining of progressive increasing shortness of breath since the yesterday associated with cough and brownish pinkish phlegm. Denies any fever or chills denies any factors. Patient in ER was noted to be tachycardic with mild respiratory distress requiring her multiple episodes of beta agonist her inhaler treatment without much improvement. States he is an out of his albuterol and is running low on his Advair. Denies any chest pain nausea or vomiting diaphoresis denies palpitation lightheadedness or syncope denies history of PND orthopnea leg swelling.  Past Medical History  Diagnosis Date  . Diabetes mellitus   . Hypertension   . COPD (chronic obstructive pulmonary disease)     History reviewed. No pertinent past surgical history.  History reviewed. No pertinent family history. Social History:  reports that he has been smoking Cigarettes.  He does not have any smokeless tobacco history on file. He reports that he drinks alcohol. He reports that he does not use illicit drugs.  Allergies: No Known Allergies   (Not in a hospital admission)  Results for orders placed during the hospital encounter of 09/23/11 (from the past 48 hour(s))  CBC     Status: Abnormal   Collection Time   09/23/11  8:52 AM      Component Value Range Comment   WBC 12.4 (*) 4.0 - 10.5 (K/uL)    RBC 5.61  4.22 - 5.81 (MIL/uL)    Hemoglobin 16.9  13.0 - 17.0 (g/dL)    HCT 96.0  45.4 - 09.8 (%)    MCV 88.6  78.0 - 100.0 (fL)    MCH 30.1  26.0 - 34.0 (pg)    MCHC  34.0  30.0 - 36.0 (g/dL)    RDW 11.9  14.7 - 82.9 (%)    Platelets 131 (*) 150 - 400 (K/uL)   DIFFERENTIAL     Status: Abnormal   Collection Time   09/23/11  8:52 AM      Component Value Range Comment   Neutrophils Relative 68  43 - 77 (%)    Neutro Abs 8.4 (*) 1.7 - 7.7 (K/uL)    Lymphocytes Relative 24  12 - 46 (%)    Lymphs Abs 2.9  0.7 - 4.0 (K/uL)    Monocytes Relative 6  3 - 12 (%)    Monocytes Absolute 0.8  0.1 - 1.0 (K/uL)    Eosinophils Relative 2  0 - 5 (%)    Eosinophils Absolute 0.2  0.0 - 0.7 (K/uL)    Basophils Relative 0  0 - 1 (%)    Basophils Absolute 0.0  0.0 - 0.1 (K/uL)   BASIC METABOLIC PANEL     Status: Abnormal   Collection Time   09/23/11  8:52 AM      Component Value Range Comment   Sodium 143  135 - 145 (mEq/L)    Potassium 3.5  3.5 - 5.1 (mEq/L)    Chloride 107  96 - 112 (mEq/L)    CO2 28  19 - 32 (mEq/L)    Glucose, Bld 169 (*) 70 - 99 (mg/dL)    BUN 13  6 -  23 (mg/dL)    Creatinine, Ser 1.61  0.50 - 1.35 (mg/dL)    Calcium 9.7  8.4 - 10.5 (mg/dL)    GFR calc non Af Amer 90 (*) >90 (mL/min)    GFR calc Af Amer >90  >90 (mL/min)    Dg Chest Portable 1 View  09/23/2011  *RADIOLOGY REPORT*  Clinical Data: Shortness of breath, difficulty breathing, similar episode 2 weeks ago with chest pain, history smoking, hypertension, diabetes  PORTABLE CHEST - 1 VIEW  Comparison: Portable exam 0917 hours compared to 08/31/2011  Findings: Normal heart size, mediastinal contours, and pulmonary vascularity. Lungs clear. No pleural effusion or pneumothorax. No acute osseous findings.  IMPRESSION: No acute abnormalities.  Original Report Authenticated By: Lollie Marrow, M.D.    Review of Systems  Constitutional: Negative for fever, chills and weight loss.  Eyes: Negative for blurred vision and double vision.  Respiratory: Positive for cough, sputum production, shortness of breath and wheezing. Negative for hemoptysis.   Cardiovascular: Negative for chest pain,  palpitations, orthopnea and leg swelling.  Gastrointestinal: Negative for nausea, vomiting and abdominal pain.  Musculoskeletal: Negative for myalgias.  Neurological: Negative for dizziness, tingling and headaches.    Blood pressure 116/69, pulse 103, resp. rate 21, SpO2 93.00%. Physical Exam  Constitutional: He is oriented to person, place, and time. He appears well-developed and well-nourished.  HENT:  Head: Normocephalic and atraumatic.  Eyes: Conjunctivae are normal. Left eye exhibits no discharge. No scleral icterus.  Neck: Normal range of motion. Neck supple. No JVD present. No thyromegaly present.  Cardiovascular:       Tachycardic S1-S2 normal is soft systolic murmur noted no S3 gallop  Respiratory: He is in respiratory distress (Mild respiratory distress. Decreased breath sound at bases with bilateral wheezing).  GI: Soft. Bowel sounds are normal. He exhibits no distension. There is no tenderness. There is no rebound and no guarding.  Musculoskeletal: He exhibits no edema and no tenderness.  Lymphadenopathy:    He has no cervical adenopathy.  Neurological: He is alert and oriented to person, place, and time.     Assessment/Plan Acute exacerbation of COPD Bronchitis Mild coronary artery disease Hypertension Non-insulin-dependent diabetes mellitus History of tobacco abuse History of forkball abuse Hypercholesteremia Positive family history of coronary artery disease Plan As per orders  Elzia Hott N 09/23/2011, 12:54 PM

## 2011-09-23 NOTE — ED Provider Notes (Signed)
Medical screening examination/treatment/procedure(s) were conducted as a shared visit with non-physician practitioner(s) and myself.  I personally evaluated the patient during the encounter.....Marland KitchenCRITICAL CARE Performed by: Donnetta Hutching  ?  Total critical care time: 30  Critical care time was exclusive of separately billable procedures and treating other patients.  Critical care was necessary to treat or prevent imminent or life-threatening deterioration.  Critical care was time spent personally by me on the following activities: development of treatment plan with patient and/or surrogate as well as nursing, discussions with consultants, evaluation of patient's response to treatment, examination of patient, obtaining history from patient or surrogate, ordering and performing treatments and interventions, ordering and review of laboratory studies, ordering and review of radiographic studies, pulse oximetry and re-evaluation of patient's condition..... patient with long-standing COPD now with significant flareup.  Chest x-ray, repetitive breathing treatments, admit  Donnetta Hutching, MD 09/23/11 1221

## 2011-09-24 LAB — CBC
HCT: 47.7 % (ref 39.0–52.0)
Hemoglobin: 16.3 g/dL (ref 13.0–17.0)
RBC: 5.43 MIL/uL (ref 4.22–5.81)

## 2011-09-24 LAB — HEMOGLOBIN A1C: Mean Plasma Glucose: 160 mg/dL — ABNORMAL HIGH (ref <117)

## 2011-09-24 LAB — GLUCOSE, CAPILLARY: Glucose-Capillary: 166 mg/dL — ABNORMAL HIGH (ref 70–99)

## 2011-09-24 LAB — BASIC METABOLIC PANEL
BUN: 14 mg/dL (ref 6–23)
Chloride: 105 mEq/L (ref 96–112)
GFR calc Af Amer: >90 mL/min (ref >90)
GFR calc non Af Amer: >90 mL/min (ref >90)
Potassium: 4.2 mEq/L (ref 3.5–5.1)
Sodium: 139 mEq/L (ref 135–145)

## 2011-09-24 MED ORDER — BUDESONIDE-FORMOTEROL FUMARATE 160-4.5 MCG/ACT IN AERO
2.0000 | INHALATION_SPRAY | Freq: Two times a day (BID) | RESPIRATORY_TRACT | Status: DC
  Administered 2011-09-24 – 2011-09-30 (×12): 2 via RESPIRATORY_TRACT
  Filled 2011-09-24: qty 6

## 2011-09-24 NOTE — Progress Notes (Signed)
Clinical social worker confirmed with [facility] that patient can return when medically stable if patient is physical able to stay in shelter. CSW will provide documentation of patient hospitalization for patient to return as requested by shelter.   .Clinical social worker continuing to follow pt to assist with pt dc plans and further csw needs.   Catha Gosselin, Theresia Majors  [phone] .09/24/2011 11:49am

## 2011-09-24 NOTE — Clinical Documentation Improvement (Signed)
GENERIC DOCUMENTATION CLARIFICATION QUERY  THIS DOCUMENT IS NOT A PERMANENT PART OF THE MEDICAL RECORD  TO RESPOND TO THE THIS QUERY, FOLLOW THE INSTRUCTIONS BELOW:  1. If needed, update documentation for the patient's encounter via the notes activity.  2. Access this query again and click edit on the In [employer].  3. After updating, or not, click F2 to complete all highlighted (required) fields concerning your review. Select "additional documentation in the medical record" OR "no additional documentation provided".  4. Click Sign note button.  5. The deficiency will fall out of your In Basket *Please let us know if you are not able to complete this workflow by phone or e-mail (listed below).  Please update your documentation within the medical record to reflect your response to this query.                                                                                        09/24/11   Dear Dr. Sharyn Lull / Associates,  In a better effort to capture your patient's severity of illness, reflect appropriate length of stay and utilization of resources, a review of the patient medical record has revealed the following indicators.  THANK YOU FOR RESPONDING TO THIS QUERY.  Possible Clinical Conditions? - Acute Respiratory Distress - Other Condition (please specify) - Cannot Clinically Determine  Supporting Information: - Risk Factors: AECOPD with bronchitis - Signs & Symptoms: per H&P:"Mild respiratory distress. Decreased breath sound at bases with bilateral wheezing", cough, sputum production, shortness of breath and wheezing - Treatment: Xopenex nebs 4x/day and q2h prn, Advair, Rocephin IV, Solumedrol IV q6h, O2 2lNC, pulse ox, IS   You may use possible, probable, or suspect with inpatient documentation. possible, probable, suspected diagnoses MUST be documented at the time of discharge  Reviewed:  no additional documentation provided  Thank You,  Beverley Fiedler RN Clinical  Documentation Specialist: Pager: [phone] HIM: [phone] Health Information Management 

## 2011-09-24 NOTE — Progress Notes (Signed)
CSW attempted to assess patient, however patient currently with aid. .Clinical social worker continuing to follow pt to assist with pt dc plans and further csw needs.   Catha Gosselin, Theresia Majors  [phone] .09/24/2011 10:55am

## 2011-09-24 NOTE — Progress Notes (Signed)
UR Completed Arrionna Serena Graves-Bigelow, RN,BSN [phone]  

## 2011-09-24 NOTE — Progress Notes (Signed)
Inpatient Diabetes Program Recommendations  AACE/ADA: New Consensus Statement on Inpatient Glycemic Control (2009)  Target Ranges:  Prepandial:   less than 140 mg/dL      Peak postprandial:   less than 180 mg/dL (1-2 hours)      Critically ill patients:  140 - 180 mg/dL    Results for Jeremiah, Burnett (MRN [id number]) as of 09/24/2011 12:31  Ref. Range 09/23/2011 16:44 09/23/2011 21:05  Glucose-Capillary Latest Range: 70-99 mg/dL 409 (H) 811 (H)    Results for Jeremiah, Burnett (MRN [id number]) as of 09/24/2011 12:31  Ref. Range 09/24/2011 07:29  Glucose-Capillary Latest Range: 70-99 mg/dL 213 (H)     Inpatient Diabetes Program Recommendations Insulin - Basal: Patient may need basal insulin if fasting CBGs stay elevated while he is on steroids in hospital. Correction (SSI): Please increase Novolog SSI to Moderate scale tid ac + HS while patient on IV steroids.  Note: Will follow. Ambrose Finland RN, MSN, CDE Diabetes Coordinator Inpatient Diabetes Program [phone]

## 2011-09-24 NOTE — Care Management Note (Addendum)
    Page 1 of 1   09/30/2011     10:34:23 AM   CARE MANAGEMENT NOTE 09/30/2011  Patient:  Jeremiah Burnett, Jeremiah Burnett   Account Number:  [id number]  Date Initiated:  09/24/2011  Documentation initiated by:  GRAVES-BIGELOW,Nilan Iddings  Subjective/Objective Assessment:   Pt admitted with COPD exacerbation. Pt was recently d/c to [facility]. On last admission CSW did give pt information onthe [facility] building.     Action/Plan:   Pt has medicare and medicaid. Pt will not be eligiblefor indigent fund. CMspoke to pt and he does not have a PCP at this time. CM offered pt a list of MD's accepting medicaid and he refused. Pt wants MD Harwani as PCP.   Anticipated DC Date:  09/26/2011   Anticipated DC Plan:  HOME/SELF CARE      DC Planning Services  CM consult      Choice offered to / List presented to:             Status of service:  Completed, signed off Medicare Important Message given?   (If response is "NO", the following Medicare IM given date fields will be blank) Date Medicare IM given:   Date Additional Medicare IM given:    Discharge Disposition:  HOME/SELF CARE  Per UR Regulation:  Reviewed for med. necessity/level of care/duration of stay  If discussed at Long Length of Stay Meetings, dates discussed:    Comments:  09-30-11 [address]  Tomi Bamberger, RN,BSN [phone] [facility]W given pt a bus pass for transportation.   09-24-11 115[address] Tomi Bamberger, [place] [phone] Pt stated that he would have the conversation with [facility] for PCP. Pt sated he was unable to get meds due to he did not have a Rx. He stated he had been in [place] and had returned to [place]. CM discussed that he needs to contact SS for assistance with his meidicaid card. P [facility] and he stated for all meds cost will be 21.00. he does not have a issue with cost. He needs PCP to write out  Rx for him. No further needs from CM at this time.

## 2011-09-24 NOTE — Progress Notes (Signed)
Subjective:  Patient denies any chest pain states breathing is slightly improved  Objective:  Vital Signs in the last 24 hours: Temp:  [97.4 F (36.3 C)-97.7 F (36.5 C)] 97.4 F (36.3 C) (06/12 0400) Pulse Rate:  [91-110] 91  (06/12 0400) Resp:  [18-24] 20  (06/12 0400) BP: (106-129)/(60-83) 127/83 mmHg (06/12 0400) SpO2:  [93 %-97 %] 97 % (06/12 0616) Weight:  [83.416 kg (183 lb 14.4 oz)] 83.416 kg (183 lb 14.4 oz) (06/12 0500)  Intake/Output from previous day: 06/11 0701 - 06/12 0700 In: 240 [P.O.:240] Out: 1000 [Urine:1000] Intake/Output from this shift: Total I/O In: 360 [P.O.:360] Out: 700 [Urine:700]  Physical Exam: Neck: no adenopathy, no carotid bruit, no JVD and supple, symmetrical, trachea midline Lungs: Decreased breath sound bilaterally with expiratory wheezing Heart: regular rate and rhythm, S1, S2 normal, no murmur, click, rub or gallop Abdomen: soft, non-tender; bowel sounds normal; no masses,  no organomegaly Extremities: extremities normal, atraumatic, no cyanosis or edema  Lab Results:  Basename 09/24/11 0500 09/23/11 1601  WBC 18.9* 10.8*  HGB 16.3 16.1  PLT 146* 139*    Basename 09/24/11 0500 09/23/11 1601  NA 139 139  K 4.2 3.3*  CL 105 100  CO2 21 22  GLUCOSE 200* 352*  BUN 14 15  CREATININE 0.64 0.77   No results found for this basename: TROPONINI:2,CK,MB:2 in the last 72 hours Hepatic Function Panel  Basename 09/23/11 1601  PROT 6.5  ALBUMIN 3.4*  AST 10  ALT 12  ALKPHOS 90  BILITOT 0.7  BILIDIR --  IBILI --   No results found for this basename: CHOL in the last 72 hours No results found for this basename: PROTIME in the last 72 hours  Imaging: Imaging results have been reviewed and Dg Chest Portable 1 View  09/23/2011  *RADIOLOGY REPORT*  Clinical Data: Shortness of breath, difficulty breathing, similar episode 2 weeks ago with chest pain, history smoking, hypertension, diabetes  PORTABLE CHEST - 1 VIEW  Comparison: Portable  exam 0917 hours compared to 08/31/2011  Findings: Normal heart size, mediastinal contours, and pulmonary vascularity. Lungs clear. No pleural effusion or pneumothorax. No acute osseous findings.  IMPRESSION: No acute abnormalities.  Original Report Authenticated By: Lollie Marrow, M.D.    Cardiac Studies:  Assessment/Plan:  Acute exacerbation of COPD  Bronchitis  Mild coronary artery disease  Hypertension  Non-insulin-dependent diabetes mellitus  History of tobacco abuse  History of forkball abuse  Hypercholesteremia  Positive family history of coronary artery disease  Plan Continue present management.  LOS: 1 day    Stephana Morell N 09/24/2011, 11:25 AM

## 2011-09-24 NOTE — Clinical Social Work Psychosocial (Signed)
     Clinical Social Work Department BRIEF PSYCHOSOCIAL ASSESSMENT 09/24/2011  Patient:  Jeremiah Burnett, Jeremiah Burnett     Account Number:  [id number]     Admit date:  09/23/2011  Clinical Social Worker:  Doree Albee  Date/Time:  09/24/2011 11:39 AM  Referred by:  RN  Date Referred:  09/24/2011 Referred for  Homelessness   Other Referral:   Interview type:  Patient Other interview type:    PSYCHOSOCIAL DATA Living Status:  FACILITY Admitted from facility:  [facility] Level of care:  Independent Living Primary support name:  none Primary support relationship to patient:  NONE Degree of support available:   none    CURRENT CONCERNS Current Concerns  Post-Acute Placement   Other Concerns:    SOCIAL WORK ASSESSMENT / PLAN CSW met with patient at bedside to discuss pt current living environment. CSW is very familiar with patient as csw discharged patient to [facility] at prior hosptizliation within the past month.  Pt shared with CSW that he plans to return when medically stable and pt had informed shelter of pt admittance to hospital. Pt stated that he had not been able to go to [facility] due to not being able to walk far.    Currently patient stated he had no other needs at this time. [facility] in order to find permanent housing. CSW and pt discussed the importance of visiting the [facility] to see if they could assist with pt long term plans and permanent housing.   Assessment/plan status:  Psychosocial Support/Ongoing Assessment of Needs Other assessment/ plan:   and discharge planning.   Information/referral to community resources:   [facility], housing assistance program,    PATIENTS/FAMILYS RESPONSE TO PLAN OF CARE: Pt appreciated csw concern and support. Pt plans to return to [facility] when medically stable for discharge. CSW will confirm with [facility]. 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

## 2011-09-25 LAB — CULTURE, RESPIRATORY W GRAM STAIN: Culture: NORMAL

## 2011-09-25 LAB — GLUCOSE, CAPILLARY
Glucose-Capillary: 192 mg/dL — ABNORMAL HIGH (ref 70–99)
Glucose-Capillary: 227 mg/dL — ABNORMAL HIGH (ref 70–99)

## 2011-09-25 MED ORDER — METHYLPREDNISOLONE SODIUM SUCC 125 MG IJ SOLR
60.0000 mg | Freq: Three times a day (TID) | INTRAMUSCULAR | Status: DC
  Administered 2011-09-25 – 2011-09-26 (×3): 60 mg via INTRAVENOUS
  Filled 2011-09-25 (×3): qty 0.96

## 2011-09-25 NOTE — Progress Notes (Signed)
Pt states he was smoking 1 ppd but he quit 1 month ago and has remained tobacco free since. Congratulated and encouraged pt to remain tobacco free.  Pt in maintenance stage. Discussed relapse prevention strategies. Referred to 1-800 quit now for f/u and support. Discussed oral fixation substitutes, second hand smoke and in home smoking policy. Reviewed and gave pt Written education/contact information.

## 2011-09-25 NOTE — Progress Notes (Signed)
Inpatient Diabetes Program Recommendations  AACE/ADA: New Consensus Statement on Inpatient Glycemic Control (2009)  Target Ranges:  Prepandial:   less than 140 mg/dL      Peak postprandial:   less than 180 mg/dL (1-2 hours)      Critically ill patients:  140 - 180 mg/dL    Results for ELON, EOFF (MRN [id number]) as of 09/25/2011 14:09  Ref. Range 09/24/2011 07:29 09/24/2011 12:30 09/24/2011 16:33 09/24/2011 21:11  Glucose-Capillary Latest Range: 70-99 mg/dL 409 (H) 811 (H) 914 (H) 166 (H)   Results for YISROEL, MULLENDORE (MRN [id number]) as of 09/25/2011 14:09  Ref. Range 09/25/2011 07:26 09/25/2011 11:31  Glucose-Capillary Latest Range: 70-99 mg/dL 086 (H) 578 (H)     Inpatient Diabetes Program Recommendations Insulin - Basal: . Correction (SSI): Currently on Novolog Sensitive SSI Insulin - Meal Coverage: PO intake 100%- May want to add meal coverage while patient on IV steroids- Novolog 4 units tid with meals.  Note: Will follow. Ambrose Finland RN, MSN, CDE Diabetes Coordinator Inpatient Diabetes Program [phone]

## 2011-09-25 NOTE — Progress Notes (Signed)
Subjective:  Patient denies any chest pain states breathing is slowly improving. Also complains of cough with whitish yellow mucus now which is improved. Denies any fever or chills  Objective:  Vital Signs in the last 24 hours: Temp:  [97 F (36.1 C)-97.8 F (36.6 C)] 97.8 F (36.6 C) (06/13 0500) Pulse Rate:  [69-90] 69  (06/13 0500) Resp:  [18-22] 22  (06/13 0500) BP: (106-119)/(64-79) 118/79 mmHg (06/13 0500) SpO2:  [93 %-98 %] 98 % (06/13 0500)  Intake/Output from previous day: 06/12 0701 - 06/13 0700 In: 960 [P.O.:960] Out: 1400 [Urine:1400] Intake/Output from this shift:    Physical Exam: Neck: no adenopathy, no carotid bruit, no JVD and supple, symmetrical, trachea midline Lungs: Decreased breath sound at bases with faint bilateral expiratory wheezing Heart: regular rate and rhythm, S1, S2 normal, no murmur, click, rub or gallop Abdomen: soft, non-tender; bowel sounds normal; no masses,  no organomegaly Extremities: extremities normal, atraumatic, no cyanosis or edema  Lab Results:  Basename 09/24/11 0500 09/23/11 1601  WBC 18.9* 10.8*  HGB 16.3 16.1  PLT 146* 139*    Basename 09/24/11 0500 09/23/11 1601  NA 139 139  K 4.2 3.3*  CL 105 100  CO2 21 22  GLUCOSE 200* 352*  BUN 14 15  CREATININE 0.64 0.77   No results found for this basename: TROPONINI:2,CK,MB:2 in the last 72 hours Hepatic Function Panel  Basename 09/23/11 1601  PROT 6.5  ALBUMIN 3.4*  AST 10  ALT 12  ALKPHOS 90  BILITOT 0.7  BILIDIR --  IBILI --   No results found for this basename: CHOL in the last 72 hours No results found for this basename: PROTIME in the last 72 hours  Imaging: Imaging results have been reviewed and Dg Chest Portable 1 View  09/23/2011  *RADIOLOGY REPORT*  Clinical Data: Shortness of breath, difficulty breathing, similar episode 2 weeks ago with chest pain, history smoking, hypertension, diabetes  PORTABLE CHEST - 1 VIEW  Comparison: Portable exam 0917 hours  compared to 08/31/2011  Findings: Normal heart size, mediastinal contours, and pulmonary vascularity. Lungs clear. No pleural effusion or pneumothorax. No acute osseous findings.  IMPRESSION: No acute abnormalities.  Original Report Authenticated By: Lollie Marrow, M.D.    Cardiac Studies:  Assessment/Plan:  Resolving exacerbation of COPD  Resolving Bronchitis  Marked leukocytosis secondary to steroids Mild coronary artery disease  Hypertension  Non-insulin-dependent diabetes mellitus  History of tobacco abuse  History of forkball abuse  Hypercholesteremia  Positive family history of coronary artery disease  Plan Taper Solu-Medrol as per orders  LOS: 2 days    Jeremiah Burnett 09/25/2011, 8:40 AM

## 2011-09-26 LAB — GLUCOSE, CAPILLARY: Glucose-Capillary: 208 mg/dL — ABNORMAL HIGH (ref 70–99)

## 2011-09-26 MED ORDER — ACETAMINOPHEN 325 MG PO TABS
650.0000 mg | ORAL_TABLET | Freq: Four times a day (QID) | ORAL | Status: DC | PRN
  Administered 2011-09-26: 650 mg via ORAL
  Filled 2011-09-26: qty 2

## 2011-09-26 MED ORDER — PREDNISONE 20 MG PO TABS
40.0000 mg | ORAL_TABLET | Freq: Every day | ORAL | Status: AC
  Administered 2011-09-26 – 2011-09-28 (×3): 40 mg via ORAL
  Filled 2011-09-26 (×3): qty 2

## 2011-09-26 NOTE — Progress Notes (Signed)
CSW continuing to follow to assist with pt discharge plans. Pt plans to return to [facility] when medically stable. CSW confirmed with shelter that patient can return when medically stable. CSW will need to provide documentation of hospitalization and any limitations pt may have at time of discharge as requested by shelter.   Catha Gosselin, Theresia Majors  [phone] .09/26/2011 1518pm

## 2011-09-26 NOTE — Progress Notes (Signed)
Subjective:  Patient denies any chest pain still complains of the shortness of breath and wheezing. Denies any fever or chills cough improved Objective:  Vital Signs in the last 24 hours: Temp:  [97.7 F (36.5 C)-98 F (36.7 C)] 97.9 F (36.6 C) (06/14 0500) Pulse Rate:  [67-90] 67  (06/14 0500) Resp:  [21-22] 22  (06/14 0500) BP: (123-142)/(78-91) 142/91 mmHg (06/14 0500) SpO2:  [91 %-97 %] 97 % (06/14 0836)  Intake/Output from previous day: 06/13 0701 - 06/14 0700 In: 720 [P.O.:720] Out: 475 [Urine:475] Intake/Output from this shift:    Physical Exam: Neck: no adenopathy, no carotid bruit, no JVD and supple, symmetrical, trachea midline Lungs: Bilateral faint expiratory wheezing noted decreased breath sound at bases Heart: regular rate and rhythm, S1, S2 normal, no murmur, click, rub or gallop Abdomen: soft, non-tender; bowel sounds normal; no masses,  no organomegaly Extremities: extremities normal, atraumatic, no cyanosis or edema  Lab Results:  Basename 09/24/11 0500 09/23/11 1601  WBC 18.9* 10.8*  HGB 16.3 16.1  PLT 146* 139*    Basename 09/24/11 0500 09/23/11 1601  NA 139 139  K 4.2 3.3*  CL 105 100  CO2 21 22  GLUCOSE 200* 352*  BUN 14 15  CREATININE 0.64 0.77   No results found for this basename: TROPONINI:2,CK,MB:2 in the last 72 hours Hepatic Function Panel  Basename 09/23/11 1601  PROT 6.5  ALBUMIN 3.4*  AST 10  ALT 12  ALKPHOS 90  BILITOT 0.7  BILIDIR --  IBILI --   No results found for this basename: CHOL in the last 72 hours No results found for this basename: PROTIME in the last 72 hours  Imaging: Imaging results have been reviewed and No results found.  Cardiac Studies:  Assessment/Plan:  Resolving exacerbation of COPD  Resolving Bronchitis  Marked leukocytosis secondary to steroids  Mild coronary artery disease  Hypertension  Non-insulin-dependent diabetes mellitus  History of tobacco abuse  History of forkball abuse    Hypercholesteremia  Positive family history of coronary artery disease  Plan DC IV Solu-Medrol changed to by mouth prednisone as per orders. Dr. Algie Coffer on-call for me  LOS: 3 days    Gunnison Chahal N 09/26/2011, 1:20 PM

## 2011-09-27 LAB — GLUCOSE, CAPILLARY: Glucose-Capillary: 182 mg/dL — ABNORMAL HIGH (ref 70–99)

## 2011-09-27 NOTE — Progress Notes (Signed)
Subjective:  Feeling tired. Afebrile.  Objective:  Vital Signs in the last 24 hours: Temp:  [97.8 F (36.6 C)-98.3 F (36.8 C)] 98.3 F (36.8 C) (06/15 0500) Pulse Rate:  [60-85] 85  (06/15 0500) Cardiac Rhythm:  [-] Normal sinus rhythm (06/14 1943) Resp:  [22] 22  (06/15 0500) BP: (123-125)/(57-75) 125/75 mmHg (06/15 0500) SpO2:  [94 %-100 %] 97 % (06/15 0823) FiO2 (%):  [28 %] 28 % (06/14 2011) Weight:  [79.833 kg (176 lb)] 79.833 kg (176 lb) (06/15 0500)  Physical Exam: BP Readings from Last 1 Encounters:  09/27/11 125/75    Wt Readings from Last 1 Encounters:  09/27/11 79.833 kg (176 lb)    Weight change:   HEENT: [place]/AT, Eyes- PERL, EOMI, Conjunctiva-Pink, Sclera-Non-icteric Neck: No JVD, No bruit, Trachea midline. Lungs:  Expiratory wheezes, bilateral. Cardiac:  Regular rhythm, normal S1 and S2, no S3.  Abdomen:  Soft, non-tender. Extremities:  No edema present. No cyanosis. No clubbing. CNS: AxOx3, Cranial nerves grossly intact, moves all 4 extremities. Right handed. Skin: Warm and dry.   Intake/Output from previous day: 06/14 0701 - 06/15 0700 In: -  Out: 750 [Urine:750]    Lab Results: BMET    Component Value Date/Time   NA 139 09/24/2011 0500   K 4.2 09/24/2011 0500   CL 105 09/24/2011 0500   CO2 21 09/24/2011 0500   GLUCOSE 200* 09/24/2011 0500   BUN 14 09/24/2011 0500   CREATININE 0.64 09/24/2011 0500   CALCIUM 9.4 09/24/2011 0500   GFRNONAA >90 09/24/2011 0500   GFRAA >90 09/24/2011 0500   CBC    Component Value Date/Time   WBC 18.9* 09/24/2011 0500   RBC 5.43 09/24/2011 0500   HGB 16.3 09/24/2011 0500   HCT 47.7 09/24/2011 0500   PLT 146* 09/24/2011 0500   MCV 87.8 09/24/2011 0500   MCH 30.0 09/24/2011 0500   MCHC 34.2 09/24/2011 0500   RDW 14.9 09/24/2011 0500   LYMPHSABS 0.3* 09/23/2011 1601   MONOABS 0.0* 09/23/2011 1601   EOSABS 0.0 09/23/2011 1601   BASOSABS 0.0 09/23/2011 1601   CARDIAC ENZYMES Lab Results  Component Value Date   CKTOTAL 65  09/01/2011   CKMB 2.3 09/01/2011   TROPONINI <0.30 09/01/2011    Assessment/Plan:  Patient Active Hospital Problem List: Acute exacerbation of chronic obstructive pulmonary disease (COPD) (09/23/2011) Bronchitis CAD Hypertension DM, II  2-D echo for LV function. Continue prednisone in tapering dose.   LOS: 4 days    Orpah Cobb  MD  09/27/2011, 10:42 AM

## 2011-09-27 NOTE — Progress Notes (Signed)
  Echocardiogram 2D Echocardiogram has been performed.  Jordie Schreur, Real Cons 09/27/2011, 3:26 PM

## 2011-09-28 LAB — DIFFERENTIAL
Basophils Absolute: 0 10*3/uL (ref 0.0–0.1)
Eosinophils Absolute: 0.2 10*3/uL (ref 0.0–0.7)
Eosinophils Relative: 1 % (ref 0–5)
Lymphocytes Relative: 26 % (ref 12–46)

## 2011-09-28 LAB — COMPREHENSIVE METABOLIC PANEL
ALT: 36 U/L (ref 0–53)
AST: 22 U/L (ref 0–37)
Albumin: 3 g/dL — ABNORMAL LOW (ref 3.5–5.2)
Calcium: 9.2 mg/dL (ref 8.4–10.5)
Sodium: 140 mEq/L (ref 135–145)
Total Protein: 5.8 g/dL — ABNORMAL LOW (ref 6.0–8.3)

## 2011-09-28 LAB — CBC
MCH: 30.6 pg (ref 26.0–34.0)
MCV: 88.9 fL (ref 78.0–100.0)
Platelets: 133 10*3/uL — ABNORMAL LOW (ref 150–400)
RDW: 14.8 % (ref 11.5–15.5)
WBC: 13.2 10*3/uL — ABNORMAL HIGH (ref 4.0–10.5)

## 2011-09-28 LAB — GLUCOSE, CAPILLARY: Glucose-Capillary: 278 mg/dL — ABNORMAL HIGH (ref 70–99)

## 2011-09-28 MED ORDER — LEVALBUTEROL HCL 1.25 MG/0.5ML IN NEBU
1.2500 mg | INHALATION_SOLUTION | Freq: Four times a day (QID) | RESPIRATORY_TRACT | Status: DC
  Filled 2011-09-28 (×2): qty 0.5

## 2011-09-28 NOTE — Progress Notes (Signed)
Subjective:  Only 10 % better. + exertional shortness of breath.  Objective:  Vital Signs in the last 24 hours: Temp:  [97.7 F (36.5 C)-98.4 F (36.9 C)] 98.3 F (36.8 C) (06/16 0500) Pulse Rate:  [62-70] 62  (06/16 0500) Cardiac Rhythm:  [-] Normal sinus rhythm (06/15 2000) Resp:  [20-24] 20  (06/16 0500) BP: (111-140)/(63-84) 140/84 mmHg (06/16 0500) SpO2:  [92 %-100 %] 100 % (06/16 0500)  Physical Exam: BP Readings from Last 1 Encounters:  09/28/11 140/84    Wt Readings from Last 1 Encounters:  09/27/11 79.833 kg (176 lb)    Weight change:   HEENT: [place]/AT, Eyes- PERL, EOMI, Conjunctiva-Pink, Sclera-Non-icteric Neck: No JVD, No bruit, Trachea midline. Lungs:  Clear, Bilateral. Cardiac:  Regular rhythm, normal S1 and S2, no S3.  Abdomen:  Soft, non-tender. Extremities:  No edema present. No cyanosis. No clubbing. CNS: AxOx3, Cranial nerves grossly intact, moves all 4 extremities. Right handed. Skin: Warm and dry.   Intake/Output from previous day: 06/15 0701 - 06/16 0700 In: 723 [P.O.:720; I.V.:3] Out: 1975 [Urine:1975]    Lab Results: BMET    Component Value Date/Time   NA 140 09/28/2011 0620   K 4.0 09/28/2011 0620   CL 99 09/28/2011 0620   CO2 31 09/28/2011 0620   GLUCOSE 98 09/28/2011 0620   BUN 20 09/28/2011 0620   CREATININE 0.82 09/28/2011 0620   CALCIUM 9.2 09/28/2011 0620   GFRNONAA 86* 09/28/2011 0620   GFRAA >90 09/28/2011 0620   CBC    Component Value Date/Time   WBC 13.2* 09/28/2011 0620   RBC 5.66 09/28/2011 0620   HGB 17.3* 09/28/2011 0620   HCT 50.3 09/28/2011 0620   PLT 133* 09/28/2011 0620   MCV 88.9 09/28/2011 0620   MCH 30.6 09/28/2011 0620   MCHC 34.4 09/28/2011 0620   RDW 14.8 09/28/2011 0620   LYMPHSABS 3.4 09/28/2011 0620   MONOABS 1.8* 09/28/2011 0620   EOSABS 0.2 09/28/2011 0620   BASOSABS 0.0 09/28/2011 0620   CARDIAC ENZYMES Lab Results  Component Value Date   CKTOTAL 65 09/01/2011   CKMB 2.3 09/01/2011   TROPONINI <0.30 09/01/2011     Assessment/Plan:  Patient Active Hospital Problem List: Acute exacerbation of chronic obstructive pulmonary disease (COPD) (09/23/2011) Bronchitis  CAD  Hypertension  DM, II  Continue medical treatment.  May need oxygen at home.   LOS: 5 days    Orpah Cobb  MD  09/28/2011, 9:03 AM

## 2011-09-29 ENCOUNTER — Inpatient Hospital Stay (HOSPITAL_COMMUNITY): Payer: Medicare Other

## 2011-09-29 LAB — GLUCOSE, CAPILLARY
Glucose-Capillary: 122 mg/dL — ABNORMAL HIGH (ref 70–99)
Glucose-Capillary: 100 mg/dL — ABNORMAL HIGH (ref 70–99)

## 2011-09-29 MED ORDER — LEVALBUTEROL HCL 1.25 MG/0.5ML IN NEBU
1.2500 mg | INHALATION_SOLUTION | RESPIRATORY_TRACT | Status: DC
  Administered 2011-09-29 – 2011-09-30 (×9): 1.25 mg via RESPIRATORY_TRACT
  Filled 2011-09-29 (×14): qty 0.5

## 2011-09-29 MED ORDER — BISACODYL 10 MG RE SUPP
10.0000 mg | Freq: Once | RECTAL | Status: AC
  Administered 2011-09-30: 10 mg via RECTAL
  Filled 2011-09-29: qty 1

## 2011-09-29 NOTE — Plan of Care (Signed)
Problem: Phase II Progression Outcomes Goal: Dyspnea controlled w/progressive activity Outcome: Not Progressing Pt continues to be SOB with minimal exertion.  Pt stated he takes nebulizer every 4 hours around the clock at home just for maintenance.  MD updated and orders for nebulizer frequency changed to every 4 hours.

## 2011-09-29 NOTE — Progress Notes (Signed)
SATURATION QUALIFICATIONS:  Patient Saturations on Room Air at Rest = 99%  Patient Saturations on Room Air while Ambulating = 93% The lowest O2 saturation during ambulation with no oxygen was 91%.  Patient averaged 93-94% while ambulating.  Tolerated ambulation well.  Will continue to monitor. Nolon Nations

## 2011-09-29 NOTE — Progress Notes (Signed)
RN noticed abdominal distention.  Pt stated "this is not normal for him and began during this admission".  MD notified, new orders received.

## 2011-09-30 MED ORDER — PREDNISONE (PAK) 10 MG PO TABS: 10.0000 mg | ORAL_TABLET | Freq: Every day | ORAL | Status: AC

## 2011-09-30 MED ORDER — GUAIFENESIN-DM 100-10 MG/5ML PO SYRP: 5.0000 mL | ORAL_SOLUTION | ORAL | Status: AC | PRN

## 2011-09-30 MED ORDER — GUAIFENESIN-DM 100-10 MG/5ML PO SYRP: 5.0000 mL | ORAL_SOLUTION | ORAL | Status: DC | PRN

## 2011-09-30 NOTE — Discharge Instructions (Signed)

## 2011-09-30 NOTE — Progress Notes (Signed)
CSW assisted with pt discharge plans to return to [facility]. CSW provided patient with letter stating pt dates of admission as well as requesting patient being able to have more rest in the lobby as pt continues to recover. Pt was given information regarding income based apartments and low in come apartments. Pt is motivated to follow up with the resources and to look for permanent housing. Pt also is motivated to follow up with the [facility] for further assistance with housing. Pt provided bus pass to return to shelter.   Catha Gosselin, Theresia Majors  [phone] .09/30/2011 1026am

## 2011-09-30 NOTE — Progress Notes (Signed)
Pt's assessment unchanged from this am. He is in stable condition and discharged to a bus via wheelchair to outside. No further discharge questions

## 2011-09-30 NOTE — Progress Notes (Signed)
Subjective:  No chest pain. Exertional dyspnea but Oxygen sat over 91 %.  Objective:  Vital Signs in the last 24 hours: Temp:  98.1 F  Pulse Rate:  67 Cardiac Rhythm:  Normal sinus rhythm Resp:  18  BP: (115)/(71) SpO2:  94 %  Physical Exam: BP Readings from Last 1 Encounters:  09/29/11 115/71    Wt Readings from Last 1 Encounters:  09/27/11 79.833 kg (176 lb)    Weight change:   HEENT: [place]/AT, Eyes-PERL, EOMI, Conjunctiva-Pink, Sclera-Non-icteric Neck: No JVD, No bruit, Trachea midline. Lungs:  Clear, Bilateral. Cardiac:  Regular rhythm, normal S1 and S2, no S3.  Abdomen:  Soft, non-tender. Extremities:  No edema present. No cyanosis. No clubbing. CNS: AxOx3, Cranial nerves grossly intact, moves all 4 extremities. Right handed. Skin: Warm and dry.   Intake/Output from previous day: 06/17 0701 - 06/18 0700 In: 956 [P.O.:956] Out: 3000 [Urine:3000]    Lab Results: BMET    Component Value Date/Time   NA 140 09/28/2011 0620   K 4.0 09/28/2011 0620   CL 99 09/28/2011 0620   CO2 31 09/28/2011 0620   GLUCOSE 98 09/28/2011 0620   BUN 20 09/28/2011 0620   CREATININE 0.82 09/28/2011 0620   CALCIUM 9.2 09/28/2011 0620   GFRNONAA 86* 09/28/2011 0620   GFRAA >90 09/28/2011 0620   CBC    Component Value Date/Time   WBC 13.2* 09/28/2011 0620   RBC 5.66 09/28/2011 0620   HGB 17.3* 09/28/2011 0620   HCT 50.3 09/28/2011 0620   PLT 133* 09/28/2011 0620   MCV 88.9 09/28/2011 0620   MCH 30.6 09/28/2011 0620   MCHC 34.4 09/28/2011 0620   RDW 14.8 09/28/2011 0620   LYMPHSABS 3.4 09/28/2011 0620   MONOABS 1.8* 09/28/2011 0620   EOSABS 0.2 09/28/2011 0620   BASOSABS 0.0 09/28/2011 0620   CARDIAC ENZYMES Lab Results  Component Value Date   CKTOTAL 65 09/01/2011   CKMB 2.3 09/01/2011   TROPONINI <0.30 09/01/2011    Assessment/Plan:  Patient Active Hospital Problem List: Acute exacerbation of chronic obstructive pulmonary disease (COPD) (09/23/2011) Bronchitis  CAD  Hypertension  DM,  II  Home soon   LOS: 6 days    Orpah Cobb  MD  09/30/2011, 9:02 AM

## 2011-09-30 NOTE — Discharge Summary (Signed)
Physician Discharge Summary  Patient ID: Jeremiah Burnett MRN: [id number] DOB/AGE: [id number] y.o.  Admit date: 09/23/2011 Discharge date: 09/30/2011  Admission Diagnoses: Acute exacerbation of chronic obstructive pulmonary disease (COPD)*Principle daignosis Bronchitis  CAD  Hypertension  DM, II   Discharge Diagnoses:  Active Problems: Acute exacerbation of chronic obstructive pulmonary disease (COPD)*Principle daignosis Bronchitis  CAD  Hypertension  DM, II    Discharged Condition: good  Hospital Course: 71 years old white homeless male presented with progressive shortness of breath with wheezing and cough. He responded to breathing treatments supplemented with prednisone.  Consults: None  Significant Diagnostic Studies: labs: Mildly high WBC and Hgb level. Slightly low platelet count. Normal electrolytes and radiology: CXR: normal  Treatments: IV hydration, antibiotics: ceftriaxone and steroids: prednisone  Discharge Exam: Blood pressure 121/81, pulse 73, temperature 98.1 F (36.7 C), temperature source Oral, resp. rate 18, height 5\' 8"  (1.727 m), weight 79.833 kg (176 lb), SpO2 96.00%. HEENT: [place]/AT, Eyes-PERL, EOMI, Conjunctiva-Pink, Sclera-Non-icteric  Neck: No JVD, No bruit, Trachea midline.  Lungs: Clear, Bilateral.  Cardiac: Regular rhythm, normal S1 and S2, no S3.  Abdomen: Soft, non-tender.  Extremities: No edema present. No cyanosis. No clubbing.  CNS: AxOx3, Cranial nerves grossly intact, moves all 4 extremities. Right handed.  Skin: Warm and dry.   Disposition: 01-Home or Self Care   Medication List  As of 09/30/2011  9:09 AM   ASK your doctor about these medications         AMARYL 2 MG tablet   Generic drug: glimepiride   Take 2 mg by mouth daily before breakfast.      aspirin EC 81 MG tablet   Take 81 mg by mouth daily.      atorvastatin 80 MG tablet   Commonly known as: LIPITOR   Take 80 mg by mouth daily.      Fluticasone-Salmeterol 250-50  MCG/DOSE Aepb   Commonly known as: ADVAIR   Inhale 1 puff into the lungs every 12 (twelve) hours.      losartan 100 MG tablet   Commonly known as: COZAAR   Take 100 mg by mouth daily.      predniSONE 5 MG Tabs   Commonly known as: STERAPRED UNI-PAK   Take 1 tablet (5 mg total) by mouth taper from 4 doses each day to 1 dose and stop.             SignedOrpah Cobb S 09/30/2011, 9:09 AM

## 2011-09-30 NOTE — Progress Notes (Signed)
UR Completed Tyrian Peart Graves-Bigelow, RN,BSN [phone]  

## 2011-11-24 ENCOUNTER — Encounter (HOSPITAL_COMMUNITY): Payer: Self-pay | Admitting: Emergency Medicine

## 2011-11-24 ENCOUNTER — Emergency Department (HOSPITAL_COMMUNITY): Payer: Medicare Other

## 2011-11-24 ENCOUNTER — Emergency Department (HOSPITAL_COMMUNITY)
Admission: EM | Admit: 2011-11-24 | Discharge: 2011-11-24 | Disposition: A | Discharge status: Home / Self Care | Payer: Medicare Other | Attending: Emergency Medicine | Admitting: Emergency Medicine

## 2011-11-24 DIAGNOSIS — J449 Chronic obstructive pulmonary disease, unspecified: Secondary | ICD-10-CM | POA: Insufficient documentation

## 2011-11-24 DIAGNOSIS — R0789 Other chest pain: Secondary | ICD-10-CM | POA: Insufficient documentation

## 2011-11-24 DIAGNOSIS — R0989 Other specified symptoms and signs involving the circulatory and respiratory systems: Secondary | ICD-10-CM | POA: Insufficient documentation

## 2011-11-24 DIAGNOSIS — R0600 Dyspnea, unspecified: Secondary | ICD-10-CM

## 2011-11-24 DIAGNOSIS — F17200 Nicotine dependence, unspecified, uncomplicated: Secondary | ICD-10-CM | POA: Insufficient documentation

## 2011-11-24 DIAGNOSIS — Z7982 Long term (current) use of aspirin: Secondary | ICD-10-CM | POA: Insufficient documentation

## 2011-11-24 DIAGNOSIS — E119 Type 2 diabetes mellitus without complications: Secondary | ICD-10-CM | POA: Insufficient documentation

## 2011-11-24 DIAGNOSIS — Z8739 Personal history of other diseases of the musculoskeletal system and connective tissue: Secondary | ICD-10-CM | POA: Insufficient documentation

## 2011-11-24 DIAGNOSIS — I10 Essential (primary) hypertension: Secondary | ICD-10-CM | POA: Insufficient documentation

## 2011-11-24 DIAGNOSIS — R0602 Shortness of breath: Secondary | ICD-10-CM | POA: Insufficient documentation

## 2011-11-24 DIAGNOSIS — R0609 Other forms of dyspnea: Secondary | ICD-10-CM | POA: Insufficient documentation

## 2011-11-24 DIAGNOSIS — J4489 Other specified chronic obstructive pulmonary disease: Secondary | ICD-10-CM | POA: Insufficient documentation

## 2011-11-24 LAB — CBC WITH DIFFERENTIAL/PLATELET
Eosinophils Absolute: 0.1 10*3/uL (ref 0.0–0.7)
Hemoglobin: 17.2 g/dL — ABNORMAL HIGH (ref 13.0–17.0)
Lymphocytes Relative: 37 % (ref 12–46)
Lymphs Abs: 3.4 10*3/uL (ref 0.7–4.0)
MCH: 30.4 pg (ref 26.0–34.0)
MCV: 88.9 fL (ref 78.0–100.0)
Monocytes Relative: 9 % (ref 3–12)
Neutrophils Relative %: 52 % (ref 43–77)
RBC: 5.66 MIL/uL (ref 4.22–5.81)

## 2011-11-24 LAB — POCT I-STAT TROPONIN I: Troponin i, poc: 0 ng/mL (ref 0.00–0.08)

## 2011-11-24 LAB — BASIC METABOLIC PANEL
BUN: 12 mg/dL (ref 6–23)
CO2: 27 mEq/L (ref 19–32)
GFR calc non Af Amer: 85 mL/min — ABNORMAL LOW (ref >90)
Glucose, Bld: 126 mg/dL — ABNORMAL HIGH (ref 70–99)
Potassium: 3.3 mEq/L — ABNORMAL LOW (ref 3.5–5.1)

## 2011-11-24 LAB — GLUCOSE, CAPILLARY: Glucose-Capillary: 106 mg/dL — ABNORMAL HIGH (ref 70–99)

## 2011-11-24 MED ORDER — IPRATROPIUM BROMIDE 0.02 % IN SOLN
RESPIRATORY_TRACT | Status: AC
  Administered 2011-11-24: 0.5 mg via RESPIRATORY_TRACT
  Filled 2011-11-24: qty 2.5

## 2011-11-24 MED ORDER — MIDAZOLAM HCL 2 MG/2ML IJ SOLN: 2.0000 mg | Freq: Once | INTRAMUSCULAR | Status: DC

## 2011-11-24 MED ORDER — SODIUM CHLORIDE 0.9 % IV BOLUS (SEPSIS): 1000.0000 mL | Freq: Once | INTRAVENOUS | Status: DC

## 2011-11-24 MED ORDER — SODIUM CHLORIDE 0.9 % IV BOLUS (SEPSIS)
1000.0000 mL | Freq: Once | INTRAVENOUS | Status: AC
  Administered 2011-11-24: 1000 mL via INTRAVENOUS

## 2011-11-24 MED ORDER — ALBUTEROL SULFATE (5 MG/ML) 0.5% IN NEBU
2.5000 mg | INHALATION_SOLUTION | Freq: Once | RESPIRATORY_TRACT | Status: AC
  Administered 2011-11-24: 2.5 mg via RESPIRATORY_TRACT
  Filled 2011-11-24: qty 0.5

## 2011-11-24 MED ORDER — ALBUTEROL SULFATE (5 MG/ML) 0.5% IN NEBU
INHALATION_SOLUTION | RESPIRATORY_TRACT | Status: AC
  Administered 2011-11-24: 2.5 mg via RESPIRATORY_TRACT
  Filled 2011-11-24: qty 1

## 2011-11-24 MED ORDER — MORPHINE SULFATE 2 MG/ML IJ SOLN
2.0000 mg | Freq: Once | INTRAMUSCULAR | Status: AC
  Administered 2011-11-24: 2 mg via INTRAVENOUS
  Filled 2011-11-24: qty 1

## 2011-11-24 MED ORDER — LORAZEPAM 2 MG/ML IJ SOLN: 2.0000 mg | Freq: Once | INTRAMUSCULAR | Status: DC

## 2011-11-24 MED ORDER — IOHEXOL 350 MG/ML SOLN
80.0000 mL | Freq: Once | INTRAVENOUS | Status: AC | PRN
  Administered 2011-11-24: 80 mL via INTRAVENOUS

## 2011-11-24 MED ORDER — IPRATROPIUM BROMIDE 0.02 % IN SOLN
0.5000 mg | Freq: Once | RESPIRATORY_TRACT | Status: AC
  Administered 2011-11-24: 0.5 mg via RESPIRATORY_TRACT
  Filled 2011-11-24: qty 2.5

## 2011-11-24 MED ORDER — METHYLPREDNISOLONE SODIUM SUCC 125 MG IJ SOLR: 80.0000 mg | Freq: Once | INTRAMUSCULAR | Status: DC

## 2011-11-24 NOTE — ED Provider Notes (Signed)
History     CSN: [id number]  Arrival date & time 11/24/11  1100   First MD Initiated Contact with Patient 11/24/11 1112      Chief Complaint  Patient presents with  . Shortness of Breath  . Chest Pain    HPI:  This is a 71 year old man with COPD, CAD, HTN, and DM presenting with acute dyspnea.  He feels like his breathing might have started worsening slightly yesterday and this morning.  He started walking this morning (walked 45 minutes outside) and "over did it" because he became acutely short of breath and began experiencing chest pain.  The chest pain is located over the right, anterior, lower thorax; it is sharp in nature like it is "catching"; and the pain is worsened by deep inspiration.  He also complains of a mild "burning" in his epigastrium.  He denies N/V, dizziness, headaches, changes in vision/hearing, other abdominal pain, worsening cough, productive cough, and fevers.  He has not been sick recently.  He did travel to [place] 3 weeks ago by car.  No recent surgeries and no cancer history.   Past Medical History  Diagnosis Date  . Diabetes mellitus   . Hypertension   . COPD (chronic obstructive pulmonary disease)   . Coronary artery disease     mild cad per MD history  . Chest pain   . Shortness of breath   . Arthritis     Past Surgical History  Procedure Date  . Mandible surgery     No family history on file.  History  Substance Use Topics  . Smoking status: Current Everyday Smoker -- 0.2 packs/day for 60 years    Types: Cigarettes  . Smokeless tobacco: Never Used  . Alcohol Use: Yes     history of ETOH in  past--denies current use      Review of Systems  All other systems reviewed and are negative.    Allergies  Review of patient's allergies indicates no known allergies.  Home Medications   Current Outpatient Rx  Name Route Sig Dispense Refill  . ASPIRIN EC 81 MG PO TBEC Oral Take 81 mg by mouth daily.    . ATORVASTATIN CALCIUM 80 MG PO  TABS Oral Take 80 mg by mouth daily.     Marland Kitchen FLUTICASONE-SALMETEROL 250-50 MCG/DOSE IN AEPB Inhalation Inhale 1 puff into the lungs every 12 (twelve) hours.    Marland Kitchen GLIMEPIRIDE 2 MG PO TABS Oral Take 2 mg by mouth daily before breakfast.    . LOSARTAN POTASSIUM 100 MG PO TABS Oral Take 100 mg by mouth daily.      BP 98/62  Pulse 89  Resp 22  SpO2 100%  Physical Exam  Constitutional: He is oriented to person, place, and time. He appears well-developed and well-nourished. He appears distressed.  HENT:  Mouth/Throat: Uvula is midline, oropharynx is clear and moist and mucous membranes are normal.  Eyes: Pupils are equal, round, and reactive to light.  Cardiovascular: Normal rate, regular rhythm, normal heart sounds and normal pulses.   No murmur heard. Pulmonary/Chest: Tachypnea noted. He is in respiratory distress. He has no wheezes. He has no rales.  Abdominal: Soft. Bowel sounds are normal. He exhibits no mass. There is no hepatosplenomegaly. There is no tenderness.  Neurological: He is alert and oriented to person, place, and time. GCS eye subscore is 4. GCS verbal subscore is 5. GCS motor subscore is 6.  Skin: Skin is warm, dry and intact.  ED Course  Procedures (including critical care time)   11:15 AM - Patient complaining of shortness of breath.  CBC w/ diff, BMET, troponin ordered, nebs in progress.  11:30 AM - Pt reassessed.  Still dyspneic. Reordered DuoNebs.  Portable CXR and CTA chest ordered.  IV morphine 2mg  ordered for pain.  12:36 PM - Pt reassessed.  Slightly hypotensive.  Complaining of feeling "wore out" and fatigued.  CBG 102.  Bolus running.  1:38 PM - Pt reassessed. Resting in bed.  SpO2 96% on RA.  Pursing lips with exhalation which he says is normal.  Still complaining of fatigue and now dry mouth.  BP up to 107 systolic.  2nd bolus ordered.  2:56 PM - Pt reassessed.  Still complains of fatigue but no dyspnea. Hemodynamically stable.  Talked with Dr. Sharyn Lull.   He was cathed 3 months ago and had clean coronaries then.  He says that if we don't think it is COPD and if EKG and CEs are negative, the patient is safe for d/c.  He will see the patient in clinic tomorrow.  Troponin ordered and order to move to CDU placed.  CDU notified.   Labs Reviewed  CBC WITH DIFFERENTIAL - Abnormal; Notable for the following:    Hemoglobin 17.2 (*)     Platelets 144 (*)     All other components within normal limits  BASIC METABOLIC PANEL - Abnormal; Notable for the following:    Potassium 3.3 (*)     Glucose, Bld 126 (*)     GFR calc non Af Amer 85 (*)     All other components within normal limits  POCT I-STAT TROPONIN I   Ct Angio Chest Pe W/cm &/or Wo Cm  11/24/2011  *RADIOLOGY REPORT*  Clinical Data: Short of breath  CT ANGIOGRAPHY CHEST  Technique:  Multidetector CT imaging of the chest using the standard protocol during bolus administration of intravenous contrast. Multiplanar reconstructed images including MIPs were obtained and reviewed to evaluate the vascular anatomy.  Contrast: 80mL OMNIPAQUE IOHEXOL 350 MG/ML SOLN  Comparison: 06/22/2009  Findings: There are no filling defects in the pulmonary arterial tree to suggest acute pulmonary thromboembolism.  Negative abnormal mediastinal adenopathy.  Sub centimeter short axis diameter nodes are noted.  Minimal coronary artery calcifications.  No pneumothorax.  No pleural effusion.  Subsegmental atelectasis at the base of the lingula.  Scarring in the lateral right upper lobe on image 69 is stable.  Patchy centrilobular emphysema within the apical predilection is stable.  No destructive bone lesion.  Upper abdomen is benign.  IMPRESSION: No evidence of acute pulmonary thromboembolism.  Chronic changes.  Original Report Authenticated By: Donavan Burnet, M.D.   Dg Chest Port 1 View  11/24/2011  *RADIOLOGY REPORT*  Clinical Data: Dyspnea, chest pain  PORTABLE CHEST - 1 VIEW  Comparison: 09/23/2011  Findings:  Cardiomediastinal silhouette is stable.  Mild hyperinflation.  Stable bilateral basilar atelectasis or scarring. No acute infiltrate or pulmonary edema.  IMPRESSION: No active disease.  Stable bilateral basilar atelectasis or scarring.  Original Report Authenticated By: Natasha Mead, M.D.     1. Dyspnea   2. Atypical chest pain       MDM  History and physical are most concerning for unstable angina and PE.  CTA was negative.  EKG and first troponin was negative.  Pt had a clean coronary cath 3 months ago.  The symptoms could be from heat exhaustion and dehydration.  Unlikely to be COPD given absence  of wheezes on exam and no report of worsening cough or sputum on history.  Chest imaging negative for infectious processes in the lungs.  We will order one more troponin and if this is negative and pt remains hemodynamically stable, he can be discharged.  He will follow up with Dr. Sharyn Lull tomorrow.  To CDU, awaiting troponin result.  Lollie Sails, MD 11/24/11 1505

## 2011-11-24 NOTE — ED Provider Notes (Signed)
I saw and evaluated the patient, reviewed the resident's note and I agree with the findings and plan.  Osmany Azer, MD 11/24/11 1554 

## 2011-11-24 NOTE — ED Provider Notes (Addendum)
I saw and evaluated the patient, reviewed the resident's note and I agree with the findings and plan. He has a history of COPD.  He no longer smokes.  He has never had coronary artery disease, myocardial infarction, congestive heart failure.  He traveled to [place] 3 weeks ago.  Today, when he was walking.  He developed shortness of breath, associated with a sharp pain on the right side of his chest and a burning sensation in the midsternal area.  He denies nausea, vomiting, fevers, chills, cough.  He has not had leg pain or swelling.  On, examination.  He is hypoxic on room air.  His lungs are clear.  His abdomen is benign.  He has no swelling, or tenderness in his lower extremities.  With this chest pain, hypoxia, shortness of breath, and recent travel to [place], where concerned that he has a pulmonary embolism.  Less likely as a COPD, exacerbation, pneumonia, or ACS.  We'll perform a chest x-ray, and laboratory testing, for evaluation along with a CAT scan of his chest  Cheri Guppy, MD 11/24/11 1229   Date: 11/24/2011  Rate: 96  Rhythm: normal sinus rhythm  QRS Axis: normal  Intervals: normal  ST/T Wave abnormalities: normal  Conduction Disutrbances: none  Narrative Interpretation: unremarkable     Cheri Guppy, MD 11/24/11 1528

## 2011-11-24 NOTE — ED Notes (Signed)
Pt hx DM and is feeling weak. Took DM med this AM and did not eat breakfast. Will order CBG

## 2011-11-24 NOTE — ED Provider Notes (Signed)
The patient reports feeling "weak". No pain at present. Discussed normal findings on lab studies and follow up with Dr. Sharyn Lull this week for recheck. Stable for discharge.  Rodena Medin, PA-C 11/24/11 1626

## 2011-11-24 NOTE — ED Notes (Signed)
Pt moved to CDU st's continues to have "a Little" right sided chest pain.  Cardiac monitor NSR vitals stable.  Pt alert and oriented x's 3, skin warm and dry color appropriate.

## 2011-11-24 NOTE — ED Notes (Signed)
Report given to Kim, RN  in CDU

## 2011-11-24 NOTE — ED Notes (Addendum)
Received pt via EMS with c/o while out walking today became short of breath and experiencing chest pain with nausea. Pt given 1 Nitro and 324mg  of ASA. Pt also had 5mg  of albuterol and atrovent by EMS.

## 2011-12-02 NOTE — ED Provider Notes (Signed)
Medical screening examination/treatment/procedure(s) were conducted as a shared visit with non-physician practitioner(s) and myself.  I personally evaluated the patient during the encounter  Cheri Guppy, MD 12/02/11 [phone]

## 2013-01-06 IMAGING — CR DG ABDOMEN 2V
2 series · 2 of 2 positions shown · non-contrast
Comparison: None.

CLINICAL DATA: Distended abdomen.

ABDOMEN - 2 VIEW

[w abdomen upright]
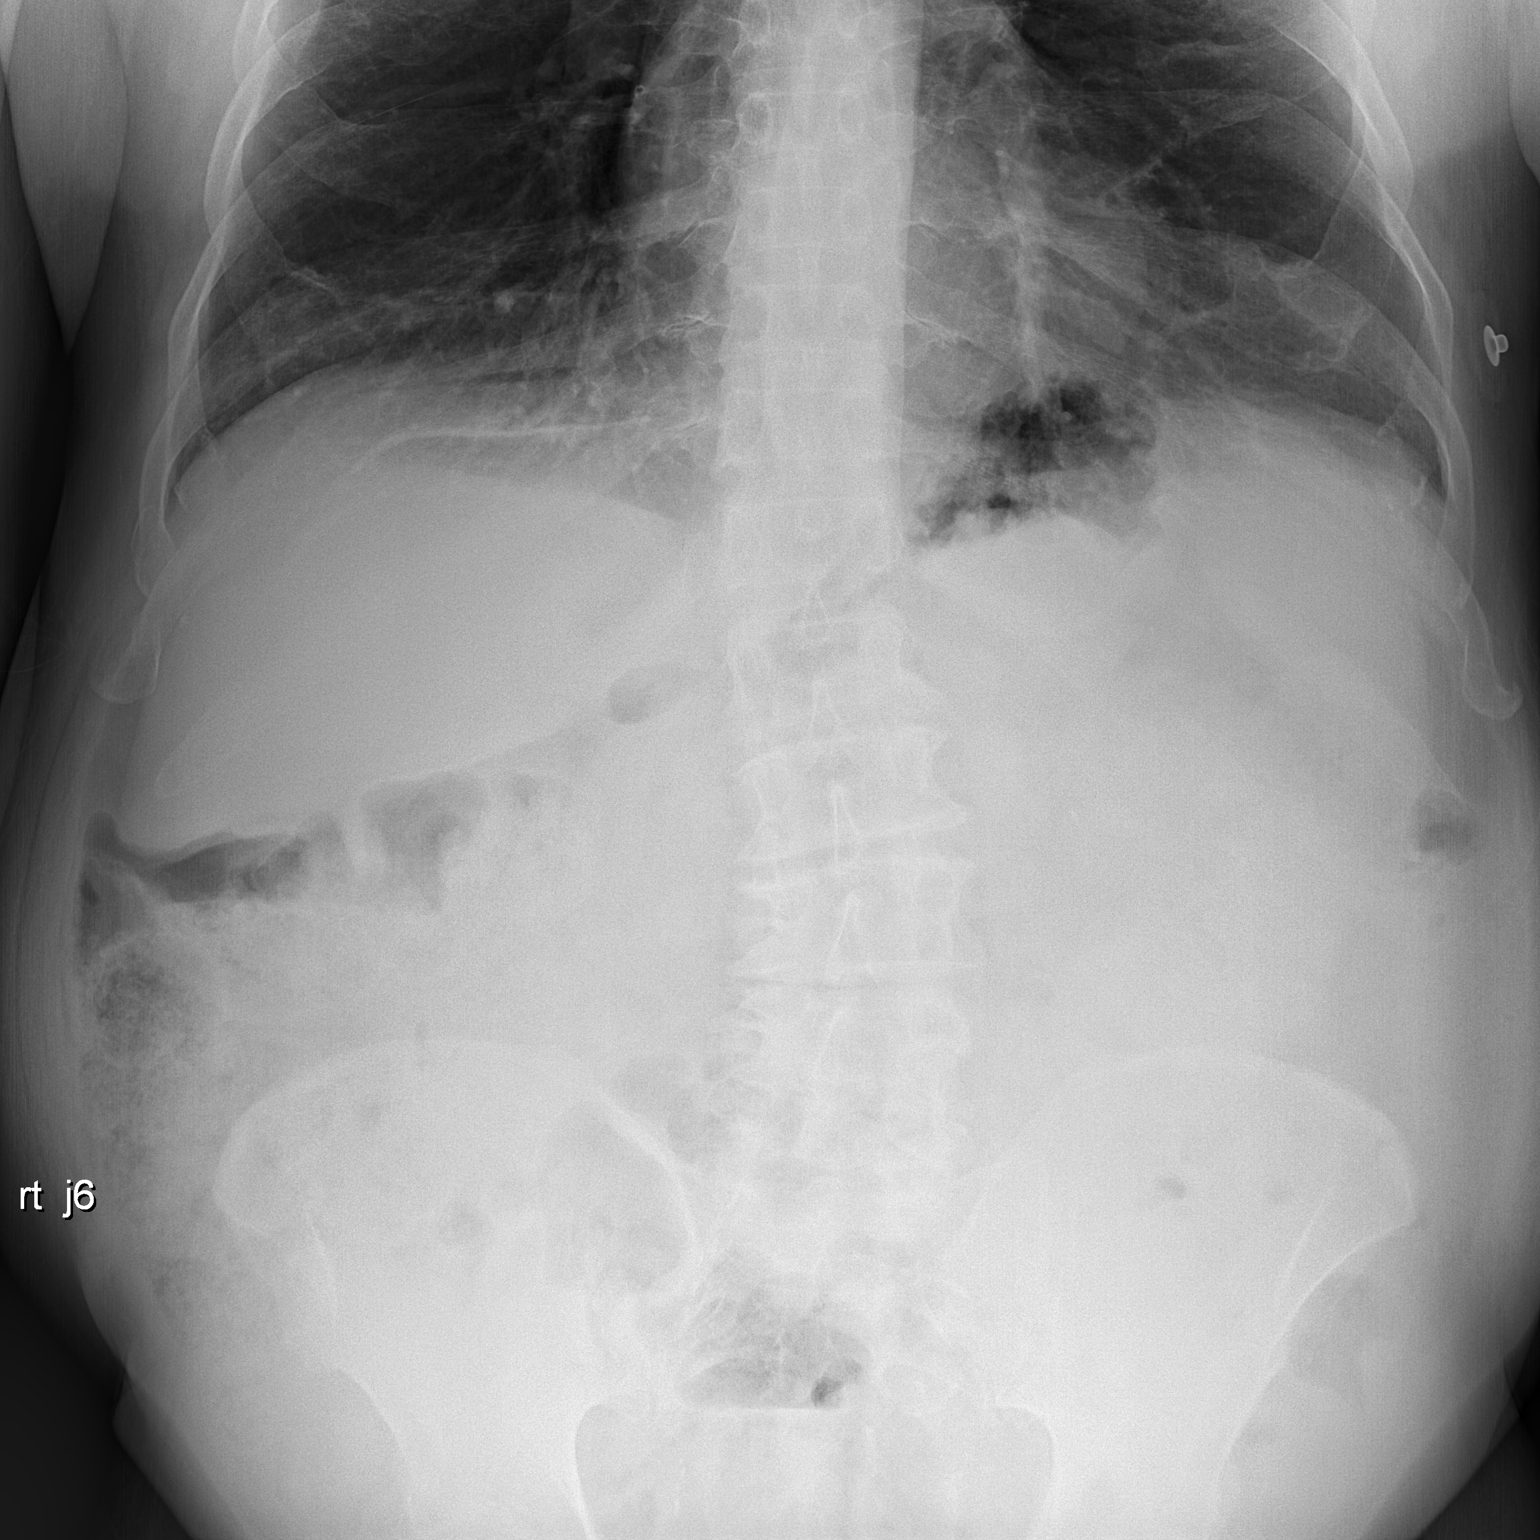

[t abdomen supine]
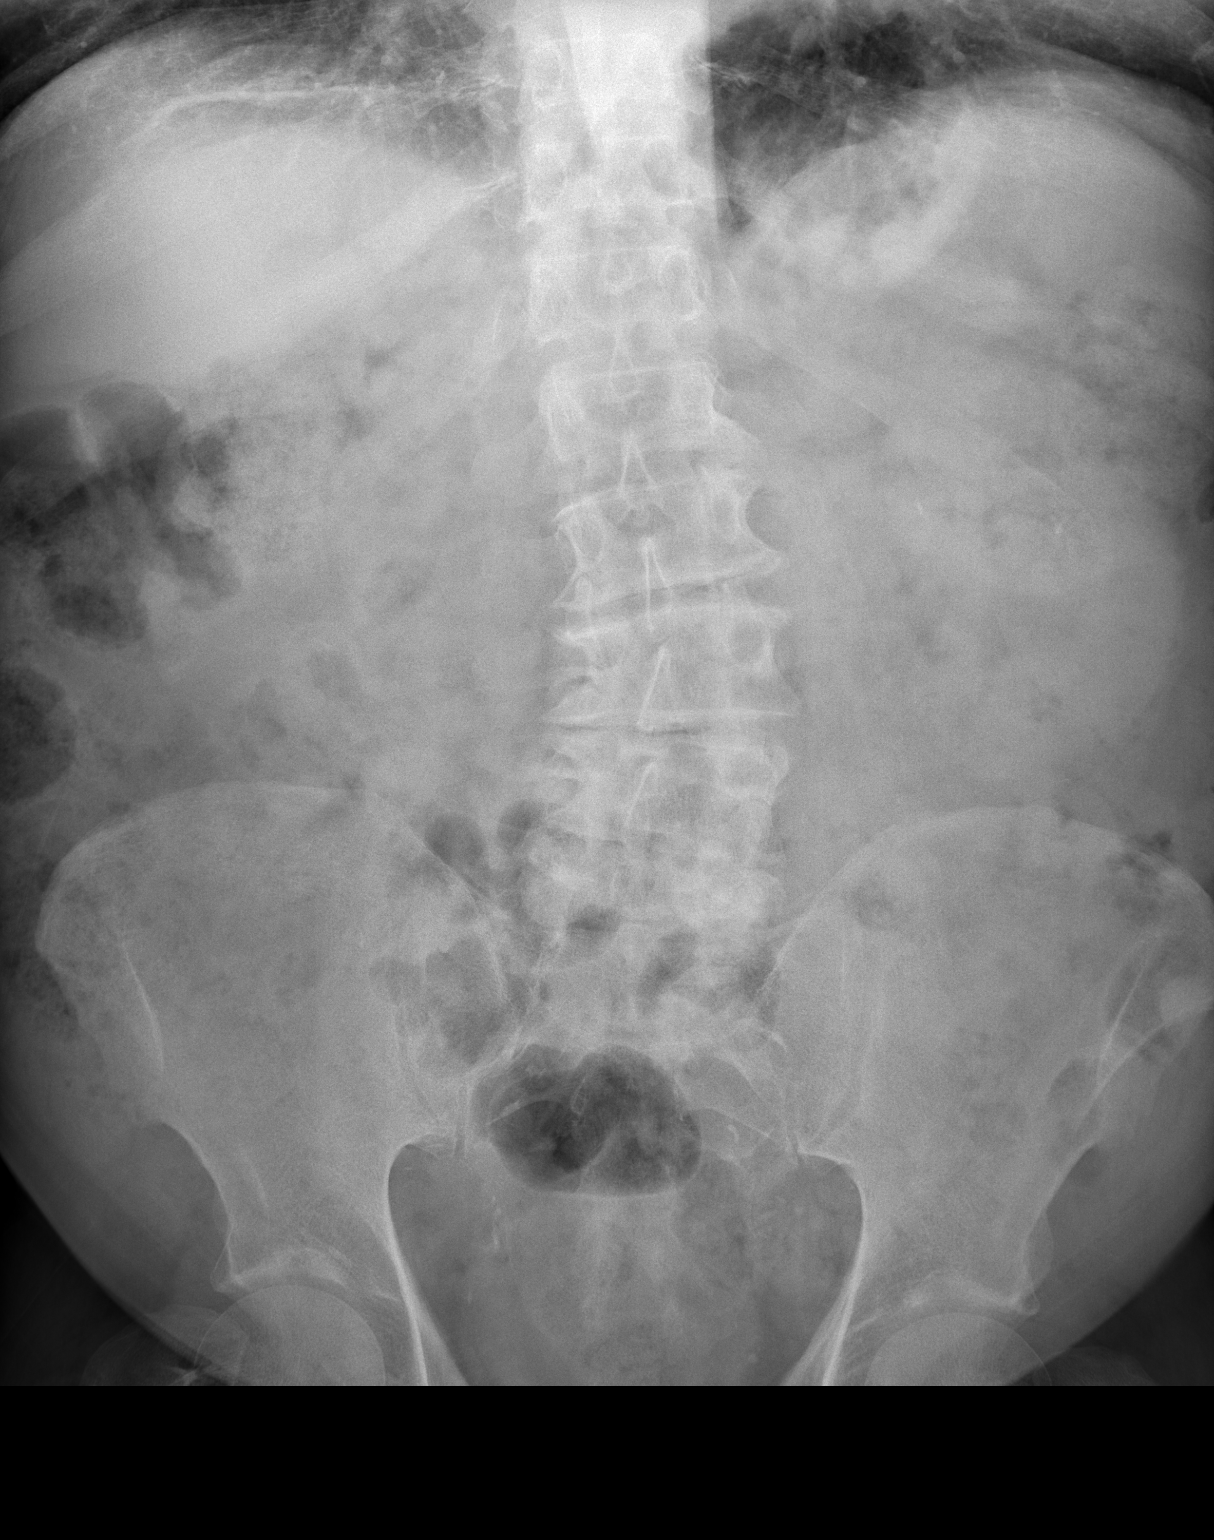

[2 of 2 positions shown; findings below may reference images not displayed]

FINDINGS: Large stool burden throughout the colon.  Nonobstructive
bowel gas pattern.  No organomegaly or evidence of free air.
Scoliosis and degenerative changes in the lumbar spine.  No acute
bony abnormality.  Calcifications in the midline of the lower
pelvis, presumably prostate.
IMPRESSION: Large stool burden throughout the colon.  No obstruction or free
air.

## 2014-03-23 ENCOUNTER — Encounter (HOSPITAL_COMMUNITY): Payer: Self-pay | Admitting: Cardiology

## 2015-06-10 ENCOUNTER — Emergency Department (HOSPITAL_COMMUNITY): Payer: Medicare Other

## 2015-06-10 ENCOUNTER — Encounter (HOSPITAL_COMMUNITY): Payer: Self-pay | Admitting: Emergency Medicine

## 2015-06-10 ENCOUNTER — Observation Stay (HOSPITAL_COMMUNITY)
Admission: EM | Admit: 2015-06-10 | Discharge: 2015-06-13 | Disposition: A | Discharge status: Home / Self Care | Payer: Medicare Other | Attending: Internal Medicine | Admitting: Internal Medicine

## 2015-06-10 DIAGNOSIS — E669 Obesity, unspecified: Secondary | ICD-10-CM | POA: Diagnosis present

## 2015-06-10 DIAGNOSIS — Z7951 Long term (current) use of inhaled steroids: Secondary | ICD-10-CM | POA: Insufficient documentation

## 2015-06-10 DIAGNOSIS — E785 Hyperlipidemia, unspecified: Secondary | ICD-10-CM

## 2015-06-10 DIAGNOSIS — Z720 Tobacco use: Secondary | ICD-10-CM | POA: Diagnosis present

## 2015-06-10 DIAGNOSIS — J449 Chronic obstructive pulmonary disease, unspecified: Secondary | ICD-10-CM | POA: Diagnosis present

## 2015-06-10 DIAGNOSIS — J441 Chronic obstructive pulmonary disease with (acute) exacerbation: Principal | ICD-10-CM | POA: Insufficient documentation

## 2015-06-10 DIAGNOSIS — Z79899 Other long term (current) drug therapy: Secondary | ICD-10-CM | POA: Diagnosis not present

## 2015-06-10 DIAGNOSIS — M1990 Unspecified osteoarthritis, unspecified site: Secondary | ICD-10-CM | POA: Insufficient documentation

## 2015-06-10 DIAGNOSIS — E119 Type 2 diabetes mellitus without complications: Secondary | ICD-10-CM

## 2015-06-10 DIAGNOSIS — R0602 Shortness of breath: Secondary | ICD-10-CM | POA: Diagnosis present

## 2015-06-10 DIAGNOSIS — Z7982 Long term (current) use of aspirin: Secondary | ICD-10-CM | POA: Diagnosis not present

## 2015-06-10 DIAGNOSIS — J9601 Acute respiratory failure with hypoxia: Secondary | ICD-10-CM | POA: Diagnosis present

## 2015-06-10 DIAGNOSIS — I2510 Atherosclerotic heart disease of native coronary artery without angina pectoris: Secondary | ICD-10-CM | POA: Insufficient documentation

## 2015-06-10 DIAGNOSIS — F17210 Nicotine dependence, cigarettes, uncomplicated: Secondary | ICD-10-CM | POA: Insufficient documentation

## 2015-06-10 DIAGNOSIS — Z9889 Other specified postprocedural states: Secondary | ICD-10-CM | POA: Insufficient documentation

## 2015-06-10 DIAGNOSIS — E1169 Type 2 diabetes mellitus with other specified complication: Secondary | ICD-10-CM | POA: Diagnosis present

## 2015-06-10 DIAGNOSIS — I10 Essential (primary) hypertension: Secondary | ICD-10-CM | POA: Insufficient documentation

## 2015-06-10 DIAGNOSIS — K219 Gastro-esophageal reflux disease without esophagitis: Secondary | ICD-10-CM | POA: Diagnosis present

## 2015-06-10 DIAGNOSIS — I5022 Chronic systolic (congestive) heart failure: Secondary | ICD-10-CM | POA: Diagnosis present

## 2015-06-10 LAB — GLUCOSE, CAPILLARY: GLUCOSE-CAPILLARY: 324 mg/dL — AB (ref 65–99)

## 2015-06-10 LAB — CBC
HEMATOCRIT: 47.5 % (ref 39.0–52.0)
Hemoglobin: 15.9 g/dL (ref 13.0–17.0)
MCH: 29.6 pg (ref 26.0–34.0)
MCHC: 33.5 g/dL (ref 30.0–36.0)
MCV: 88.5 fL (ref 78.0–100.0)
Platelets: 277 10*3/uL (ref 150–400)
RBC: 5.37 MIL/uL (ref 4.22–5.81)
RDW: 13.4 % (ref 11.5–15.5)
WBC: 9.3 10*3/uL (ref 4.0–10.5)

## 2015-06-10 LAB — BASIC METABOLIC PANEL
Anion gap: 13 (ref 5–15)
BUN: 15 mg/dL (ref 6–20)
CHLORIDE: 97 mmol/L — AB (ref 101–111)
CO2: 29 mmol/L (ref 22–32)
Calcium: 9.9 mg/dL (ref 8.9–10.3)
Creatinine, Ser: 0.98 mg/dL (ref 0.61–1.24)
GFR calc non Af Amer: >60 mL/min (ref >60)
Glucose, Bld: 203 mg/dL — ABNORMAL HIGH (ref 65–99)
POTASSIUM: 3.9 mmol/L (ref 3.5–5.1)
SODIUM: 139 mmol/L (ref 135–145)

## 2015-06-10 LAB — I-STAT TROPONIN, ED
Troponin i, poc: 0 ng/mL (ref 0.00–0.08)
Troponin i, poc: 0 ng/mL (ref 0.00–0.08)

## 2015-06-10 LAB — I-STAT CG4 LACTIC ACID, ED
LACTIC ACID, VENOUS: 2.39 mmol/L — AB (ref 0.5–2.0)
LACTIC ACID, VENOUS: 1.24 mmol/L (ref 0.5–2.0)

## 2015-06-10 LAB — CBG MONITORING, ED: Glucose-Capillary: 389 mg/dL — ABNORMAL HIGH (ref 65–99)

## 2015-06-10 MED ORDER — MIRTAZAPINE 15 MG PO TABS
30.0000 mg | ORAL_TABLET | Freq: Every day | ORAL | Status: DC
  Administered 2015-06-10 – 2015-06-12 (×3): 30 mg via ORAL
  Filled 2015-06-10 (×3): qty 2

## 2015-06-10 MED ORDER — SODIUM CHLORIDE 0.9 % IV SOLN: INTRAVENOUS | Status: DC

## 2015-06-10 MED ORDER — ALBUTEROL SULFATE (2.5 MG/3ML) 0.083% IN NEBU
5.0000 mg | INHALATION_SOLUTION | Freq: Once | RESPIRATORY_TRACT | Status: AC
  Administered 2015-06-10: 5 mg via RESPIRATORY_TRACT
  Filled 2015-06-10: qty 6

## 2015-06-10 MED ORDER — INSULIN ASPART 100 UNIT/ML ~~LOC~~ SOLN
0.0000 [IU] | Freq: Every day | SUBCUTANEOUS | Status: DC
  Administered 2015-06-10: 4 [IU] via SUBCUTANEOUS
  Administered 2015-06-11: 3 [IU] via SUBCUTANEOUS
  Administered 2015-06-12: 2 [IU] via SUBCUTANEOUS

## 2015-06-10 MED ORDER — SODIUM CHLORIDE 0.9 % IV SOLN
INTRAVENOUS | Status: DC
  Administered 2015-06-10 (×2): via INTRAVENOUS

## 2015-06-10 MED ORDER — IPRATROPIUM-ALBUTEROL 0.5-2.5 (3) MG/3ML IN SOLN
3.0000 mL | Freq: Four times a day (QID) | RESPIRATORY_TRACT | Status: DC
  Administered 2015-06-10: 3 mL via RESPIRATORY_TRACT
  Filled 2015-06-10: qty 3

## 2015-06-10 MED ORDER — INSULIN ASPART 100 UNIT/ML ~~LOC~~ SOLN: 0.0000 [IU] | Freq: Three times a day (TID) | SUBCUTANEOUS | Status: DC

## 2015-06-10 MED ORDER — IPRATROPIUM-ALBUTEROL 0.5-2.5 (3) MG/3ML IN SOLN
3.0000 mL | Freq: Once | RESPIRATORY_TRACT | Status: AC
  Administered 2015-06-10: 3 mL via RESPIRATORY_TRACT
  Filled 2015-06-10: qty 3

## 2015-06-10 MED ORDER — INSULIN GLARGINE 100 UNIT/ML ~~LOC~~ SOLN
10.0000 [IU] | Freq: Every morning | SUBCUTANEOUS | Status: DC
  Administered 2015-06-11: 10 [IU] via SUBCUTANEOUS
  Filled 2015-06-10: qty 0.1

## 2015-06-10 MED ORDER — METHYLPREDNISOLONE SODIUM SUCC 125 MG IJ SOLR
125.0000 mg | Freq: Once | INTRAMUSCULAR | Status: AC
  Administered 2015-06-10: 125 mg via INTRAVENOUS
  Filled 2015-06-10: qty 2

## 2015-06-10 MED ORDER — ALBUTEROL SULFATE (2.5 MG/3ML) 0.083% IN NEBU: 2.5000 mg | INHALATION_SOLUTION | RESPIRATORY_TRACT | Status: DC | PRN

## 2015-06-10 MED ORDER — LISINOPRIL-HYDROCHLOROTHIAZIDE 10-12.5 MG PO TABS: 1.0000 | ORAL_TABLET | Freq: Every day | ORAL | Status: DC

## 2015-06-10 MED ORDER — HYDROCHLOROTHIAZIDE 12.5 MG PO CAPS
12.5000 mg | ORAL_CAPSULE | Freq: Every day | ORAL | Status: DC
  Administered 2015-06-10 – 2015-06-13 (×4): 12.5 mg via ORAL
  Filled 2015-06-10 (×4): qty 1

## 2015-06-10 MED ORDER — ACETAMINOPHEN 650 MG RE SUPP: 650.0000 mg | Freq: Four times a day (QID) | RECTAL | Status: DC | PRN

## 2015-06-10 MED ORDER — MAGNESIUM SULFATE 2 GM/50ML IV SOLN
2.0000 g | Freq: Once | INTRAVENOUS | Status: AC
  Administered 2015-06-10: 2 g via INTRAVENOUS
  Filled 2015-06-10: qty 50

## 2015-06-10 MED ORDER — ENOXAPARIN SODIUM 40 MG/0.4ML ~~LOC~~ SOLN
40.0000 mg | SUBCUTANEOUS | Status: DC
  Administered 2015-06-10 – 2015-06-12 (×3): 40 mg via SUBCUTANEOUS
  Filled 2015-06-10 (×3): qty 0.4

## 2015-06-10 MED ORDER — INSULIN ASPART 100 UNIT/ML ~~LOC~~ SOLN
0.0000 [IU] | Freq: Three times a day (TID) | SUBCUTANEOUS | Status: DC
  Administered 2015-06-10: 15 [IU] via SUBCUTANEOUS
  Administered 2015-06-11: 5 [IU] via SUBCUTANEOUS
  Administered 2015-06-11: 3 [IU] via SUBCUTANEOUS
  Administered 2015-06-11: 8 [IU] via SUBCUTANEOUS
  Administered 2015-06-12: 3 [IU] via SUBCUTANEOUS
  Administered 2015-06-12: 5 [IU] via SUBCUTANEOUS
  Administered 2015-06-12: 15 [IU] via SUBCUTANEOUS
  Administered 2015-06-13: 3 [IU] via SUBCUTANEOUS
  Administered 2015-06-13: 8 [IU] via SUBCUTANEOUS
  Administered 2015-06-13: 3 [IU] via SUBCUTANEOUS

## 2015-06-10 MED ORDER — ALBUTEROL SULFATE (2.5 MG/3ML) 0.083% IN NEBU: 3.0000 mL | INHALATION_SOLUTION | RESPIRATORY_TRACT | Status: DC | PRN

## 2015-06-10 MED ORDER — ACETAMINOPHEN 325 MG PO TABS: 650.0000 mg | ORAL_TABLET | Freq: Four times a day (QID) | ORAL | Status: DC | PRN

## 2015-06-10 MED ORDER — LISINOPRIL 10 MG PO TABS
10.0000 mg | ORAL_TABLET | Freq: Every day | ORAL | Status: DC
  Administered 2015-06-10 – 2015-06-13 (×4): 10 mg via ORAL
  Filled 2015-06-10 (×4): qty 1

## 2015-06-10 MED ORDER — IPRATROPIUM-ALBUTEROL 0.5-2.5 (3) MG/3ML IN SOLN
3.0000 mL | Freq: Three times a day (TID) | RESPIRATORY_TRACT | Status: DC
  Administered 2015-06-11 – 2015-06-12 (×4): 3 mL via RESPIRATORY_TRACT
  Filled 2015-06-10 (×3): qty 3

## 2015-06-10 MED ORDER — INSULIN GLARGINE 100 UNIT/ML ~~LOC~~ SOLN
17.0000 [IU] | Freq: Every day | SUBCUTANEOUS | Status: DC
  Administered 2015-06-10: 17 [IU] via SUBCUTANEOUS
  Filled 2015-06-10 (×2): qty 0.17

## 2015-06-10 MED ORDER — PANTOPRAZOLE SODIUM 40 MG PO TBEC
40.0000 mg | DELAYED_RELEASE_TABLET | Freq: Every day | ORAL | Status: DC
  Administered 2015-06-10 – 2015-06-13 (×4): 40 mg via ORAL
  Filled 2015-06-10 (×4): qty 1

## 2015-06-10 MED ORDER — ASPIRIN 81 MG PO CHEW
324.0000 mg | CHEWABLE_TABLET | Freq: Once | ORAL | Status: AC
  Administered 2015-06-10: 324 mg via ORAL
  Filled 2015-06-10: qty 4

## 2015-06-10 MED ORDER — SODIUM CHLORIDE 0.9 % IV BOLUS (SEPSIS)
500.0000 mL | Freq: Once | INTRAVENOUS | Status: AC
  Administered 2015-06-10: 500 mL via INTRAVENOUS

## 2015-06-10 MED ORDER — ATORVASTATIN CALCIUM 20 MG PO TABS
20.0000 mg | ORAL_TABLET | Freq: Every day | ORAL | Status: DC
  Administered 2015-06-10 – 2015-06-13 (×4): 20 mg via ORAL
  Filled 2015-06-10 (×4): qty 1

## 2015-06-10 MED ORDER — TIOTROPIUM BROMIDE MONOHYDRATE 18 MCG IN CAPS: 18.0000 ug | ORAL_CAPSULE | Freq: Every day | RESPIRATORY_TRACT | Status: DC

## 2015-06-10 MED ORDER — CETYLPYRIDINIUM CHLORIDE 0.05 % MT LIQD
7.0000 mL | Freq: Two times a day (BID) | OROMUCOSAL | Status: DC
  Administered 2015-06-11 – 2015-06-13 (×5): 7 mL via OROMUCOSAL

## 2015-06-10 MED ORDER — GUAIFENESIN ER 600 MG PO TB12
600.0000 mg | ORAL_TABLET | Freq: Two times a day (BID) | ORAL | Status: DC
  Administered 2015-06-10 – 2015-06-13 (×6): 600 mg via ORAL
  Filled 2015-06-10 (×6): qty 1

## 2015-06-10 NOTE — ED Notes (Signed)
Pt to ED via [facility] -- was traveling through town on way home to [place] -- ran out of O2-- is on Oxygen 3l/m/[place] continuously. Pt is short of breath, with cough- dry, nonproductive today-- was productive yesterday for green sputum.  Pt received Albuterol treatment  enroute per ems.

## 2015-06-10 NOTE — ED Provider Notes (Signed)
CSN: [id number]     Arrival date & time 06/10/15  1106 History   First MD Initiated Contact with Patient 06/10/15 1135     Chief Complaint  Patient presents with  . Shortness of Breath     (Consider location/radiation/quality/duration/timing/severity/associated sxs/prior Treatment) HPI  Blood pressure 119/81, pulse 95, temperature 99.3 F (37.4 C), temperature source Oral, resp. rate 16, height  (1.727 m), weight 87.998 kg, SpO2 96 %.  Jeremiah Burnett is a 75 y.o. male complaining of shortness of breath onset this a.m. with associated retrosternal chest pain which she describes as just "an ordinary pain." Rated at 6 out of 10, no exacerbating or alleviating factors identified. Patient was traveling, his car broke down and his car was towed with his spare oxygen take on board, states that the oxygen tank he was using ran out of some point overnight. He has COPD and is a daily smoker, he is on 3 L at all times. Patient is diabetic, hypertension, unknown hyperlipidemia. States he's had coronary artery disease with no heart attacks and no stents. Shortness of breath improved after EMS gave nebulizer in route.  Past Medical History  Diagnosis Date  . Diabetes mellitus   . Hypertension   . COPD (chronic obstructive pulmonary disease) (HCC)   . Coronary artery disease     mild cad per MD history  . Chest pain   . Shortness of breath   . Arthritis    Past Surgical History  Procedure Laterality Date  . Mandible surgery    . Left heart catheterization with coronary angiogram N/A 09/02/2011    Procedure: LEFT HEART CATHETERIZATION WITH CORONARY ANGIOGRAM;  Surgeon: Robynn Pane, MD;  Location[facility] CATH LAB;  Service: Cardiovascular;  Laterality: N/A;   No family history on file. Social History  Substance Use Topics  . Smoking status: Current Every Day Smoker -- 0.25 packs/day for 60 years    Types: Cigarettes  . Smokeless tobacco: Never Used  . Alcohol Use: Yes     Comment:  history of ETOH in  past--denies current use    Review of Systems  10 systems reviewed and found to be negative, except as noted in the HPI.   Allergies  Review of patient's allergies indicates no known allergies.  Home Medications   Prior to Admission medications   Medication Sig Start Date End Date Taking? Authorizing Provider  aspirin EC 81 MG tablet Take 81 mg by mouth daily.    Historical Provider, MD  atorvastatin (LIPITOR) 80 MG tablet Take 80 mg by mouth daily.     Historical Provider, MD  budesonide-formoterol (SYMBICORT) 160-4.5 MCG/ACT inhaler Inhale 2 puffs into the lungs 2 (two) times daily.    Historical Provider, MD  glimepiride (AMARYL) 2 MG tablet Take 2 mg by mouth daily before breakfast.    Historical Provider, MD  losartan (COZAAR) 100 MG tablet Take 100 mg by mouth daily.    Historical Provider, MD   BP 119/81 mmHg  Pulse 95  Temp(Src) 99.3 F (37.4 C) (Oral)  Resp 16  Ht  (1.727 m)  Wt 87.998 kg  BMI 29.50 kg/m2  SpO2 96% Physical Exam  Constitutional: He is oriented to person, place, and time. He appears well-developed and well-nourished. No distress.  HENT:  Head: Normocephalic.  Eyes: Conjunctivae and EOM are normal.  Cardiovascular: Normal rate.   Pulmonary/Chest: Effort normal. No stridor. No respiratory distress. He has wheezes. He has no rales.  Patient speaking in  short sentences, no accessory muscle use, moderate scattered expiratory wheezing, reduced or movement in all fields.  Musculoskeletal: Normal range of motion.  Neurological: He is alert and oriented to person, place, and time.  Psychiatric: He has a normal mood and affect.  Nursing note and vitals reviewed.   ED Course  Procedures (including critical care time) Labs Review Labs Reviewed  BASIC METABOLIC PANEL  CBC  I-STAT TROPOININ, ED  I-STAT CG4 LACTIC ACID, ED    Imaging Review No results found. I have personally reviewed and evaluated these images and lab results  as part of my medical decision-making.   EKG Interpretation   Date/Time:  Sunday June 10 2015 11:16:04 EST Ventricular Rate:  99 PR Interval:  140 QRS Duration: 77 QT Interval:  346 QTC Calculation: 444 R Axis:   80 Text Interpretation:  Sinus rhythm No significant change since last  tracing Confirmed by [facility] MD, ERIN ([id number]) on 06/10/2015 12:14:11 PM      MDM   Final diagnoses:  SOB (shortness of breath)    Filed Vitals:   06/10/15 1219 06/10/15 1220 06/10/15 1230 06/10/15 1415  BP:   107/59 119/85  Pulse: 82  83 88  Temp:      TempSrc:      Resp: Height:      Weight:      SpO2: 99% 96% 99% 96%    Medications  0.9 %  sodium chloride infusion ( Intravenous New Bag/Given 06/10/15 1421)  albuterol (PROVENTIL) (2.5 MG/3ML) 0.083% nebulizer solution 2.5 mg (not administered)  albuterol (PROVENTIL) (2.5 MG/3ML) 0.083% nebulizer solution 5 mg (5 mg Nebulization Given 06/10/15 1218)  methylPREDNISolone sodium succinate (SOLU-MEDROL) 125 mg/2 mL injection 125 mg (125 mg Intravenous Given 06/10/15 1213)  magnesium sulfate IVPB 2 g 50 mL (0 g Intravenous Stopped 06/10/15 1412)  ipratropium-albuterol (DUONEB) 0.5-2.5 (3) MG/3ML nebulizer solution 3 mL (3 mLs Nebulization Given 06/10/15 1217)  sodium chloride 0.9 % bolus 500 mL (0 mLs Intravenous Stopped 06/10/15 1412)  aspirin chewable tablet 324 mg (324 mg Oral Given 06/10/15 1418)    Jeremiah Burnett is 75 y.o. male presenting with shortness of breath, patient ran out of his oxygen which she is on at all times sometimes in the middle of the night. He's having some left-sided chest pain which she attributes to stress. Patient has multiple cardiac risk factors including diabetes, hypertension, tobacco use, CAD. His EKG is unchanged, troponin is negative. Lactic acid is slightly elevated at 2.39. Considering this patient has no oxygen he cannot be discharged, this is a unassigned admission to family practice, discussed  with resident who accepts admission to MedSurg floor under attending Dr. Josem Kaufmann. Case management is consulted, patient is chest pain-free.   Wynetta Emery, PA-C 06/10/15 1500  Patient's chest pain has resolved, it seems that we might be able to get him oxygen to go home with. Will delta troponin and attending physician will evaluate him to see if he is appropriate for discharge.  Discussed with Carney Bern case management it is unclear that we will be able to get oxygen for this patient, he will need to be admitted.  Wynetta Emery, PA-C 06/10/15 1600  Alvira Monday, MD 06/11/15 1052

## 2015-06-10 NOTE — ED Notes (Signed)
Possibly may be discharged -- depending on lab results. Pt needs O2 to go home, needs Child psychotherapist and case manager consult.

## 2015-06-10 NOTE — ED Notes (Addendum)
Pt's girlfriend and girlfriend's son here visiting pt-- having to take turns because they have a dog with them-- they have no transportation, the truck they were driving broke down on the way to [place]-- in [place]. There is a 75 y/o and a dog and girlfriend that have no transportation and no money and no where to stay.

## 2015-06-10 NOTE — H&P (Signed)
Date: 06/10/2015               Patient Name:  Jeremiah Burnett MRN: [id number]  DOB: 07/18/1940 Age / Sex: 75 y.o., male   PCP: Provider Default, MD         Medical Service: Internal Medicine Teaching Service         Attending Physician: Dr. Doneen Poisson    First Contact: Dr. Darreld Mclean Pager: [phone]  Second Contact: Dr. Jill Alexanders Pager: [phone]       After Hours (After 5p/  First Contact Pager: [phone]  weekends / holidays): Second Contact Pager: [phone]   Chief Complaint: Shortness of breath, ran out of oxygen  History of Present Illness: [name] is a 75 year old male with PMH of COPD on continuous 3L [place], HTN, T2DM, CAD, Tobacco use, and GERD who presented to the ED with shortness of breath. Patient was traveling through town yesterday when his truck broke down. His extra oxygen was taken with his truck when it was towed. Him and his girlfriend stayed in a motel overnight and at some point his oxygen ran out. He woke up feeling short of breath and having chest pain. Chest pain was right sided, radiated across his abdomen, aching, 5/10, and lasted for one minute before resolving on its own. He attributes his chest pain to "nerves." It has completely resolved. Breathing is greatly improved as compared to prior, but not back to his baseline.  He has been on supplemental oxygen for 4-5 years on 3 L chronically for his COPD. He uses Spiriva once a day and albuterol as needed in between his nebulizer treatments. He states he has been needing to use his rescue albuterol more frequently lately. Normally, patient coughs up foamy white sputum (occasionally green), but today he actually notices less sputum production. On a regular day, patient will get very short of breath after ambulating a short distance. He uses a walker at home occasionally or relies on his girlfriend for support. Admits to occasional night sweats, shortness of breath, cough, occasional palpitations, chronic  joint pain. Denies fever, headache, nausea, vomiting, wheezing, diarrhea, constipation, melena, hematochezia, dysuria.  He smokes 2-3 cigarettes/day up to 1ppd for 63 years. No alcohol use. No illicit drug use. Mother had heart disease and COPD; Father had Heart Disease .  Patient received an albuterol treatment    Meds: Current Facility-Administered Medications  Medication Dose Route Frequency Provider Last Rate Last Dose  . 0.9 %  sodium chloride infusion   Intravenous Continuous Nicole Pisciotta, PA-C 125 mL/hr at 06/10/15 1708    . albuterol (PROVENTIL) (2.5 MG/3ML) 0.083% nebulizer solution 2.5 mg  2.5 mg Nebulization Q2H PRN Nicole Pisciotta, PA-C      . [START ON 06/11/2015] insulin aspart (novoLOG) injection 0-15 Units  0-15 Units Subcutaneous TID WC Darreld Mclean, MD        Allergies: Allergies as of 06/10/2015  . (No Known Allergies)   Past Medical History  Diagnosis Date  . Diabetes mellitus   . Hypertension   . COPD (chronic obstructive pulmonary disease) (HCC)   . Coronary artery disease     mild cad per MD history  . Chest pain   . Shortness of breath   . Arthritis    Past Surgical History  Procedure Laterality Date  . Mandible surgery    . Left heart catheterization with coronary angiogram N/A 09/02/2011    Procedure: LEFT HEART CATHETERIZATION WITH CORONARY ANGIOGRAM;  Surgeon:  Robynn Pane, MD;  Location: [facility] CATH [facility];  Service: Cardiovascular;  Laterality: N/A;   Family History  Problem Relation Age of Onset  . Heart failure Mother   . Heart failure Father    Social History   Social History  . Marital Status: Single    Spouse Name: N/A  . Number of Children: N/A  . Years of Education: N/A   Occupational History  . Not on file.   Social History Main Topics  . Smoking status: Current Every Day Smoker -- 0.25 packs/day for 60 years    Types: Cigarettes  . Smokeless tobacco: Never Used  . Alcohol Use: Yes     Comment: history of ETOH in   past--denies current use  . Drug Use: No  . Sexual Activity: Not Currently   Other Topics Concern  . Not on file   Social History Narrative    Review of Systems: Review of Systems  Constitutional: Positive for diaphoresis. Negative for fever and chills.  HENT: Negative for congestion and sore throat.   Respiratory: Positive for cough and shortness of breath. Negative for hemoptysis, sputum production and wheezing.   Cardiovascular: Positive for chest pain and leg swelling. Negative for palpitations and orthopnea.  Gastrointestinal: Negative for nausea, vomiting, abdominal pain, diarrhea, constipation, blood in stool and melena.  Genitourinary: Negative for dysuria and hematuria.  Musculoskeletal: Positive for joint pain. Negative for myalgias and falls.  Skin: Negative for rash.  Neurological: Negative for dizziness, tingling, focal weakness, loss of consciousness and headaches.  Psychiatric/Behavioral: Negative for substance abuse.     Physical Exam: Blood pressure 128/80, pulse 88, temperature 98.2 F (36.8 C), temperature source Oral, resp. rate 19, height  (1.727 m), weight 194 lb (87.998 kg), SpO2 100 %. Physical Exam  Constitutional: He is oriented to person, place, and time. He appears well-developed and well-nourished. No distress.  Obese man, laying in bed, wearing nasal cannula  HENT:  Head: Normocephalic and atraumatic.  Mouth/Throat: Oropharynx is clear and moist. No oropharyngeal exudate.  Eyes: EOM are normal. Pupils are equal, round, and reactive to light.  Neck: Normal range of motion. Neck supple. No tracheal deviation present.  Cardiovascular:  Distant heart sounds, normal rate, regular rhythm, no murmur/rub/gallop appreciated  Pulmonary/Chest: No respiratory distress. He exhibits no tenderness.  Diminished lung sounds, wheezing initially heard when laying supine, however after sitting up, no wheezing, rales, rhonchi heard  Abdominal: Soft. Bowel sounds  are normal. He exhibits no distension. There is no tenderness.  Musculoskeletal: Normal range of motion. He exhibits no tenderness.  Trace edema bilateral lower extremities  Neurological: He is alert and oriented to person, place, and time.  Skin: Skin is warm. He is not diaphoretic.  Psychiatric: He has a normal mood and affect.     Lab results: Basic Metabolic Panel:  Recent Labs  16/10/96 1150  NA 139  K 3.9  CL 97*  CO2 29  GLUCOSE 203*  BUN 15  CREATININE 0.98  CALCIUM 9.9   Liver Function Tests: No results for input(s): AST, ALT, ALKPHOS, BILITOT, PROT, ALBUMIN in the last 72 hours. No results for input(s): LIPASE, AMYLASE in the last 72 hours. No results for input(s): AMMONIA in the last 72 hours. CBC:  Recent Labs  06/10/15 1150  WBC 9.3  HGB 15.9  HCT 47.5  MCV 88.5  PLT 277   Cardiac Enzymes: No results for input(s): CKTOTAL, CKMB, CKMBINDEX, TROPONINI in the last 72 hours. BNP: No results for  input(s): PROBNP in the last 72 hours. D-Dimer: No results for input(s): DDIMER in the last 72 hours. CBG:  Recent Labs  06/10/15 1704  GLUCAP 389*   Hemoglobin A1C: No results for input(s): HGBA1C in the last 72 hours. Fasting Lipid Panel: No results for input(s): CHOL, HDL, LDLCALC, TRIG, CHOLHDL, LDLDIRECT in the last 72 hours. Thyroid Function Tests: No results for input(s): TSH, T4TOTAL, FREET4, T3FREE, THYROIDAB in the last 72 hours. Anemia Panel: No results for input(s): VITAMINB12, FOLATE, FERRITIN, TIBC, IRON, RETICCTPCT in the last 72 hours. Coagulation: No results for input(s): LABPROT, INR in the last 72 hours. Urine Drug Screen: Drugs of Abuse     Component Value Date/Time   LABOPIA NONE DETECTED 12/25/2008 1658   COCAINSCRNUR NONE DETECTED 12/25/2008 1658   LABBENZ NONE DETECTED 12/25/2008 1658   AMPHETMU NONE DETECTED 12/25/2008 1658   THCU NONE DETECTED 12/25/2008 1658   LABBARB  12/25/2008 1658    NONE DETECTED        DRUG  SCREEN FOR MEDICAL PURPOSES ONLY.  IF CONFIRMATION IS NEEDED FOR ANY PURPOSE, NOTIFY LAB WITHIN 5 DAYS.        LOWEST DETECTABLE LIMITS FOR URINE DRUG SCREEN Drug Class       Cutoff (ng/mL) Amphetamine      1000 Barbiturate      200 Benzodiazepine   200 Tricyclics       300 Opiates          300 Cocaine          300 THC              50    Alcohol Level: No results for input(s): ETH in the last 72 hours. Urinalysis: No results for input(s): COLORURINE, LABSPEC, PHURINE, GLUCOSEU, HGBUR, BILIRUBINUR, KETONESUR, PROTEINUR, UROBILINOGEN, NITRITE, LEUKOCYTESUR in the last 72 hours.  Invalid input(s): APPERANCEUR   Imaging results:  Dg Chest 2 View  06/10/2015  CLINICAL DATA:  76 year old male with acute shortness of breath and chest pain today. EXAM: CHEST  2 VIEW COMPARISON:  11/24/2011 and prior chest radiographs FINDINGS: COPD/emphysema changes again noted. Cardiomediastinal silhouette is unchanged. There is no evidence of focal airspace disease, pulmonary edema, suspicious pulmonary nodule/mass, pleural effusion, or pneumothorax. No acute bony abnormalities are identified. IMPRESSION: COPD/emphysema without evidence of acute cardiopulmonary disease. Electronically Signed   By: Harmon Pier M.D.   On: 06/10/2015 13:16    Other results: EKG: sinus rhythm, no acute ischemic changes, similar to prior  Assessment & Plan by Problem: Principal Problem:   Acute respiratory failure with hypoxia (HCC) Active Problems:   COPD (chronic obstructive pulmonary disease) (HCC)   HTN (hypertension)   T2DM (type 2 diabetes mellitus) (HCC)   Hyperlipidemia due to type 2 diabetes mellitus (HCC)   GERD (gastroesophageal reflux disease)   Tobacco abuse   Chronic systolic CHF (congestive heart failure) (HCC)   Obesity  [name] is a 75 year old male with PMH of COPD on continuous 3L [place], HTN, T2DM, CAD, Tobacco use, and GERD who presented to the ED with shortness of breath after running  out of home oxygen.  Acute respiratory failure with hypoxia in COPD: Patient with acute respiratory distress in setting of running out of home oxygen. He has improved significantly with Solu-medrol, Albuterol, and Duoneb treatments. Has a baseline cough that is less frequent now. Does not appear to be COPD exacerbation or requiring antibiotics at this time. Will continue with breathing treatments and fluids. If not improving,  may need to consider more steroids. -Duoneb q6h -Spiriva daily -Albuterol q2h prn -Mucinex BID -Continue oxygen at 3L via [place] -IV NS 125 mL/hr -Consider steroid if no improvement  T2DM: On Lantus 15 AM and 25 PM at home along with Metformin 1000 mg BID -Lantus 10 AM and 17 PM -SSI-M  HTN: BP controlled -Continue home Linsiopril-HCTZ 10-12.g mg daily  HLD: -Continue home Atorvastatin 20 mg  Tobacco use: Smokes 2-3 cigarettes per day, states he and wife are trying to quit. -Smoking cessation counseling  Diet: Carb modified  DVT ppx: lovenox  Code: DNI  Dispo: Disposition is deferred at this time, awaiting improvement of current medical problems. Anticipated discharge in approximately 1-2 day(s).   The patient does not know have a current PCP (Provider Default, MD) and does not need an [facility] hospital follow-up appointment after discharge.  The patient does not have transportation limitations that hinder transportation to clinic appointments.  Signed: Darreld Mclean, MD 06/10/2015, 5:34 PM

## 2015-06-10 NOTE — Care Management Note (Signed)
Case Management Note  Patient Details  Name: Jeremiah Burnett MRN: [id number] Date of Birth: 1940/04/24  Subjective/Objective:  74 y.o. M seen in the ED to assist with delivery of Oxygen Tank as pt is on 3L Oxygen at home and ran out sometime during the night. Traveling from [facility] ([place])to [place], where he reports he and girlfriend were relocating.Marland KitchenMarland KitchenMarland KitchenMarland Kitchen when his truck broke down and he had to have it towed to unknown repair shop. (Awaiting call from shop owner on Monday or Tuesday) He reports having an extra Oxygen tank in the truck. Became visibly upset when this CM questioned his agenda, where he planned to go, if we could assist with cab voucher back to [facility] etc..Marland KitchenCM unsure of patients intentions. Girlfriend at bedside, tearful that Police have asked that dog not be on Hospital property.                    Action/Plan: Contacted CSW for assistance with disposition after discharge. Pt is being placed in Observation status and is aware that he will possibly/probably be discharged Monday and will need to begin to think about where he and girlfriend will go at that time. No further CM needs at this time.   Expected Discharge Date:                  Expected Discharge Plan:     In-House Referral:     Discharge planning Services  CM Consult  Post Acute Care Choice:  Durable Medical Equipment Choice offered to:  Patient  DME Arranged:  Oxygen DME Agency:  Other - Comment [facility] in [place])  HH Arranged:    [facility] Agency:     Status of Service:  Completed, signed off  Medicare Important Message Given:    Date Medicare IM Given:    Medicare IM give by:    Date Additional Medicare IM Given:    Additional Medicare Important Message give by:     If discussed at Long Length of Stay Meetings, dates discussed:    Additional Comments:  Yvone Neu, RN 06/10/2015, 4:31 PM

## 2015-06-10 NOTE — Progress Notes (Signed)
Received from ED via stretcher.

## 2015-06-11 DIAGNOSIS — E119 Type 2 diabetes mellitus without complications: Secondary | ICD-10-CM

## 2015-06-11 DIAGNOSIS — J9601 Acute respiratory failure with hypoxia: Secondary | ICD-10-CM | POA: Diagnosis not present

## 2015-06-11 DIAGNOSIS — Z9981 Dependence on supplemental oxygen: Secondary | ICD-10-CM | POA: Diagnosis not present

## 2015-06-11 DIAGNOSIS — J449 Chronic obstructive pulmonary disease, unspecified: Secondary | ICD-10-CM | POA: Diagnosis not present

## 2015-06-11 DIAGNOSIS — Z794 Long term (current) use of insulin: Secondary | ICD-10-CM

## 2015-06-11 DIAGNOSIS — Z79899 Other long term (current) drug therapy: Secondary | ICD-10-CM

## 2015-06-11 DIAGNOSIS — I10 Essential (primary) hypertension: Secondary | ICD-10-CM | POA: Diagnosis not present

## 2015-06-11 DIAGNOSIS — J441 Chronic obstructive pulmonary disease with (acute) exacerbation: Secondary | ICD-10-CM | POA: Diagnosis not present

## 2015-06-11 DIAGNOSIS — F17210 Nicotine dependence, cigarettes, uncomplicated: Secondary | ICD-10-CM

## 2015-06-11 LAB — BASIC METABOLIC PANEL
ANION GAP: 11 (ref 5–15)
BUN: 14 mg/dL (ref 6–20)
CHLORIDE: 99 mmol/L — AB (ref 101–111)
CO2: 26 mmol/L (ref 22–32)
Calcium: 9.2 mg/dL (ref 8.9–10.3)
Creatinine, Ser: 0.93 mg/dL (ref 0.61–1.24)
Glucose, Bld: 396 mg/dL — ABNORMAL HIGH (ref 65–99)
POTASSIUM: 4.2 mmol/L (ref 3.5–5.1)
SODIUM: 136 mmol/L (ref 135–145)

## 2015-06-11 LAB — CBC
HEMATOCRIT: 42.6 % (ref 39.0–52.0)
Hemoglobin: 13.8 g/dL (ref 13.0–17.0)
MCH: 28.7 pg (ref 26.0–34.0)
MCHC: 32.4 g/dL (ref 30.0–36.0)
MCV: 88.6 fL (ref 78.0–100.0)
Platelets: 246 10*3/uL (ref 150–400)
RBC: 4.81 MIL/uL (ref 4.22–5.81)
RDW: 13.2 % (ref 11.5–15.5)
WBC: 13.8 10*3/uL — AB (ref 4.0–10.5)

## 2015-06-11 LAB — GLUCOSE, CAPILLARY
GLUCOSE-CAPILLARY: 290 mg/dL — AB (ref 65–99)
Glucose-Capillary: 276 mg/dL — ABNORMAL HIGH (ref 65–99)
Glucose-Capillary: 225 mg/dL — ABNORMAL HIGH (ref 65–99)
Glucose-Capillary: 199 mg/dL — ABNORMAL HIGH (ref 65–99)

## 2015-06-11 MED ORDER — INSULIN GLARGINE 100 UNIT/ML ~~LOC~~ SOLN
15.0000 [IU] | Freq: Every morning | SUBCUTANEOUS | Status: DC
  Administered 2015-06-12 – 2015-06-13 (×2): 15 [IU] via SUBCUTANEOUS
  Filled 2015-06-11 (×2): qty 0.15

## 2015-06-11 MED ORDER — INSULIN GLARGINE 100 UNIT/ML ~~LOC~~ SOLN
22.0000 [IU] | Freq: Every day | SUBCUTANEOUS | Status: DC
  Administered 2015-06-11 – 2015-06-13 (×3): 22 [IU] via SUBCUTANEOUS
  Filled 2015-06-11 (×3): qty 0.22

## 2015-06-11 MED ORDER — INSULIN GLARGINE 100 UNIT/ML ~~LOC~~ SOLN: SUBCUTANEOUS | Status: DC

## 2015-06-11 NOTE — Clinical Social Work Note (Addendum)
CSW talked with patient and his fiancee, Jeremiah Burnett regarding their situation and discharge plans. Per patient they (patient, fiancee and her son Jeremiah Burnett) plan to live in [place] but have not been able to look for housing due to his vehicle breaking down and his hospitalization. Ms. Jeremiah Burnett was provided with resources (family shelters) to contact regarding possible temporary housing. CSW informed that Ms. Jeremiah Burnett has an interview at a motel this week and per patient, he will receive his SSA check on Friday.  CSW will continue to follow and assist as needed with discharge planning for patient/family.  Jeremiah Burnett, MSW, LCSW Licensed Clinical Social Worker Clinical Social Work Department [employer] [phone]

## 2015-06-11 NOTE — Care Management Note (Signed)
Case Management Note  Patient Details  Name: Jeremiah Burnett MRN: [id number] Date of Birth: March 23, 1941  Subjective/Objective:            CM following for progression and d/c planning.        Action/Plan:  06/10/2015 Met with pt and Fiance as well as fiance's child, Gerald Stabs. This CM was told by the pt, his significant other and Gerald Stabs their plan was to come to the [place] area to start over. The pt states that he is from the [facility] area however does not wish to return there. He is currently on oxygen and was getting this from "[employer]" in [place].  His current portable tank is empty. He states that he has only about $70 in cash , however will receive his check at the end of the week. The "family" has no where to go at this point, however the young woman has an interview for a job on Wednesday at the [employer]. The family was provided with breakfast. This CM will work with CSW, Shelle Iron to attempt to meet pt needs.   Expected Discharge Date:  06/11/15               Expected Discharge Plan:  Home with oxygen  In-House Referral:  Clinical Social Work  Discharge planning Services  CM Consult  Post Acute Care Choice:  Durable Medical Equipment Choice offered to:  Patient  DME Arranged:  Oxygen DME Agency:  Other - Comment ([facility] in [place])  [place]:    [facility] Agency:     Status of Service:  In process, will continue to follow  Medicare Important Message Given:    Date Medicare IM Given:    Medicare IM give by:    Date Additional Medicare IM Given:    Additional Medicare Important Message give by:     If discussed at [place] of Stay Meetings, dates discussed:    Additional Comments:  Adron Bene, RN 06/11/2015, 11:56 AM

## 2015-06-11 NOTE — Discharge Instructions (Signed)
1. Resume your home oxygen use and Spiriva daily. 2. Follow up with your primary care doctor within the next two weeks.

## 2015-06-11 NOTE — Care Management Note (Signed)
Case Management Note  Patient Details  Name: Jeremiah Burnett MRN: [id number] Date of Birth: 09/04/1940  Subjective/Objective:        CM following for progression and d/c planning.            Action/Plan: CM continues to follow for d/c planning. CSW, V Sharlet Salina has met with the pt and "family" and provided a list of agencies for them to contact for possible shelter until they are able to find a place to stay. @ 2pm after multiple calls they are unable to locate lodging . Lunch provided for the child and wife. This CM spoke with Dr Posey Pronto re his orders for d/c to home. This pt has no oxygen at this time and we are unable to arrange home oxygen until the pt has an address for the delivery of the concentrator.  We have no method of filling current tank, additionally the pt would run out oxygen again. Spoke with [facility] rep Jermaine, who continues to follow for oxygen needs.   Expected Discharge Date:  Unknown               Expected Discharge Plan:  [place]  In-House Referral:  Clinical Social Work  Discharge planning Services  CM Consult  Post Acute Care Choice:  Durable Medical Equipment Choice offered to:  Patient  DME Arranged:  Oxygen DME Agency:  Other - Comment ([facility] in [place]) [facility] will provide oxygen for this pt at time of d/c, when he has an address for delivery of the concentrator.  HH Arranged:    [place] Agency:     Status of Service:  In process, will continue to follow  Medicare Important Message Given:    Date Medicare IM Given:    Medicare IM give by:    Date Additional Medicare IM Given:    Additional Medicare Important Message give by:     If discussed at [place] of Stay Meetings, dates discussed:    Additional Comments:  Adron Bene, RN 06/11/2015, 4:24 PM

## 2015-06-11 NOTE — Discharge Summary (Signed)
Name: Jeremiah Burnett MRN: [id number] DOB: October 16, 1940 75 y.o. PCP: Provider Default, MD  Date of Admission: 06/10/2015 11:06 AM Date of Discharge: 06/13/2015 Attending Physician: Doneen Poisson, MD  Discharge Diagnosis: 1. Acute Hypoxic Respiratory Failure  Principal Problem:   Acute respiratory failure with hypoxia (HCC) Active Problems:   COPD (chronic obstructive pulmonary disease) (HCC)   HTN (hypertension)   T2DM (type 2 diabetes mellitus) (HCC)   Hyperlipidemia due to type 2 diabetes mellitus (HCC)   GERD (gastroesophageal reflux disease)   Tobacco abuse   Chronic systolic CHF (congestive heart failure) (HCC)   Obesity  Discharge Medications:   Medication List    TAKE these medications        albuterol 108 (90 Base) MCG/ACT inhaler  Commonly known as:  PROVENTIL HFA;VENTOLIN HFA  Inhale 2 puffs into the lungs every 4 (four) hours as needed for wheezing or shortness of breath.     albuterol (2.5 MG/3ML) 0.083% nebulizer solution  Commonly known as:  PROVENTIL  Inhale 3 mLs into the lungs every 4 (four) hours.     atorvastatin 20 MG tablet  Commonly known as:  LIPITOR  Take 20 mg by mouth daily.     atorvastatin 20 MG tablet  Commonly known as:  LIPITOR  Take 1 tablet (20 mg total) by mouth daily at 6 PM.     insulin glargine 100 UNIT/ML injection  Commonly known as:  LANTUS  Inject 15 units every morning and 25 units every evening before bed.     lisinopril-hydrochlorothiazide 10-12.5 MG tablet  Commonly known as:  PRINZIDE,ZESTORETIC  Take 1 tablet by mouth daily.     metFORMIN 1000 MG tablet  Commonly known as:  GLUCOPHAGE  Take 1 tablet (1,000 mg total) by mouth daily with breakfast.     mirtazapine 15 MG tablet  Commonly known as:  REMERON  Take 15-30 mg by mouth at bedtime as needed.     mirtazapine 30 MG tablet  Commonly known as:  REMERON  Take 1 tablet (30 mg total) by mouth at bedtime.     omeprazole 20 MG capsule  Commonly known as:   PRILOSEC  Take 20 mg by mouth every evening.     pantoprazole 40 MG tablet  Commonly known as:  PROTONIX  Take 1 tablet (40 mg total) by mouth daily at 12 noon.     tiotropium 18 MCG inhalation capsule  Commonly known as:  SPIRIVA  Place 18 mcg into inhaler and inhale daily.     tiotropium 18 MCG inhalation capsule  Commonly known as:  SPIRIVA  Place 1 capsule (18 mcg total) into inhaler and inhale daily.        Disposition and follow-up:   Jeremiah Burnett was discharged from [facility] in Good condition.  At the hospital follow up visit please address:  1.  Home oxygen use, medication adherence, glycemic control, Consider long-acting beta agonist (LABA)  2.  Labs / imaging needed at time of follow-up: none  3.  Pending labs/ test needing follow-up: none  Follow-up Appointments: Follow-up Information    Follow up with Primary Care Doctor. Schedule an appointment as soon as possible for a visit in 2 weeks.   Why:  COPD control and diabetes check      Discharge Instructions: Discharge Instructions    Call MD for:  difficulty breathing, headache or visual disturbances    Complete by:  As directed      Call MD  for:  difficulty breathing, headache or visual disturbances    Complete by:  As directed      Call MD for:  extreme fatigue    Complete by:  As directed      Call MD for:  persistant dizziness or light-headedness    Complete by:  As directed      Call MD for:  persistant dizziness or light-headedness    Complete by:  As directed      Call MD for:  persistant nausea and vomiting    Complete by:  As directed      Call MD for:  severe uncontrolled pain    Complete by:  As directed      Call MD for:  severe uncontrolled pain    Complete by:  As directed      Call MD for:  temperature >100.4    Complete by:  As directed      Call MD for:  temperature >100.4    Complete by:  As directed      Diet - low sodium heart healthy    Complete by:  As  directed      Diet - low sodium heart healthy    Complete by:  As directed      Increase activity slowly    Complete by:  As directed      Increase activity slowly    Complete by:  As directed            Consultations:    Procedures Performed:  Dg Chest 2 View  06/10/2015  CLINICAL DATA:  75 year old male with acute shortness of breath and chest pain today. EXAM: CHEST  2 VIEW COMPARISON:  11/24/2011 and prior chest radiographs FINDINGS: COPD/emphysema changes again noted. Cardiomediastinal silhouette is unchanged. There is no evidence of focal airspace disease, pulmonary edema, suspicious pulmonary nodule/mass, pleural effusion, or pneumothorax. No acute bony abnormalities are identified. IMPRESSION: COPD/emphysema without evidence of acute cardiopulmonary disease. Electronically Signed   By: Harmon Pier M.D.   On: 06/10/2015 13:16    2D Echo:   Cardiac Cath:   Admission HPI: Jeremiah Burnett is a 75 year old male with PMH of COPD on continuous 3L [place], HTN, T2DM, CAD, Tobacco use, and GERD who presented to the ED with shortness of breath. Patient was traveling through town yesterday when his truck broke down. His extra oxygen was taken with his truck when it was towed. Him and his girlfriend stayed in a motel overnight and at some point his oxygen ran out. He woke up feeling short of breath and having chest pain. Chest pain was right sided, radiated across his abdomen, aching, 5/10, and lasted for one minute before resolving on its own. He attributes his chest pain to "nerves." It has completely resolved. Breathing is greatly improved as compared to prior, but not back to his baseline.  He has been on supplemental oxygen for 4-5 years on 3 L chronically for his COPD. He uses Spiriva once a day and albuterol as needed in between his nebulizer treatments. He states he has been needing to use his rescue albuterol more frequently lately. Normally, patient coughs up foamy white sputum  (occasionally green), but today he actually notices less sputum production. On a regular day, patient will get very short of breath after ambulating a short distance. He uses a walker at home occasionally or relies on his girlfriend for support. Admits to occasional night sweats, shortness of breath, cough, occasional palpitations,  chronic joint pain. Denies fever, headache, nausea, vomiting, wheezing, diarrhea, constipation, melena, hematochezia, dysuria.  He smokes 2-3 cigarettes/day up to 1ppd for 63 years. No alcohol use. No illicit drug use. Mother had heart disease and COPD; Father had Heart Disease .  Patient received an albuterol treatment  Hospital Course by problem list: Principal Problem:   Acute respiratory failure with hypoxia (HCC) Active Problems:   COPD (chronic obstructive pulmonary disease) (HCC)   HTN (hypertension)   T2DM (type 2 diabetes mellitus) (HCC)   Hyperlipidemia due to type 2 diabetes mellitus (HCC)   GERD (gastroesophageal reflux disease)   Tobacco abuse   Chronic systolic CHF (congestive heart failure) (HCC)   Obesity   Acute Hypoxic Respiratory Failure: Patient received Solumedrol in the ED.  He was managed with scheduled nebulizers, Spiriva, and oxygen supplementation.  Patient's dyspnea improved and returned to baseline respiratory status on 3L [place].  Patient kept in hospital to arrange for housing/shelter and home oxygen arrangements. Elwin Sleight was added during hospital stay as patient may benefit from long acting bronchodilators and can be considered on outpatient basis to add to his current regimen.  T2DM: On Lantus 15 AM and 25 PM at home along with Metformin 1000 mg BID. Started on Lantus 15 U am and 22 U pm with SSI-M during admission.  HTN: BP controlled on home Linsiopril-HCTZ 10-12.g mg daily  Discharge Vitals:   BP 126/76 mmHg  Pulse 81  Temp(Src) 98.2 F (36.8 C) (Oral)  Resp 18  Ht  (1.727 m)  Wt 194 lb 4.8 oz (88.134 kg)  BMI 29.55 kg/m2   SpO2 98%  Discharge Labs:  Results for orders placed or performed during the hospital encounter of 06/10/15 (from the past 24 hour(s))  Glucose, capillary     Status: Abnormal   Collection Time: 06/12/15  8:50 PM  Result Value Ref Range   Glucose-Capillary 236 (H) 65 - 99 mg/dL  Glucose, capillary     Status: Abnormal   Collection Time: 06/13/15  8:17 AM  Result Value Ref Range   Glucose-Capillary 183 (H) 65 - 99 mg/dL   Comment 1 Notify RN    Comment 2 Document in Chart   Glucose, capillary     Status: Abnormal   Collection Time: 06/13/15 12:02 PM  Result Value Ref Range   Glucose-Capillary 200 (H) 65 - 99 mg/dL  Glucose, capillary     Status: Abnormal   Collection Time: 06/13/15  4:34 PM  Result Value Ref Range   Glucose-Capillary 283 (H) 65 - 99 mg/dL   Comment 1 Notify RN    Comment 2 Document in Chart     Signed: Darreld Mclean, MD 06/13/2015, 6:04 PM    Services Ordered on Discharge:  Equipment Ordered on Discharge: oxygen

## 2015-06-11 NOTE — Progress Notes (Signed)
SATURATION QUALIFICATIONS: (This note is used to comply with regulatory documentation for home oxygen)  Patient Saturations on Room Air at Rest = 93%  Patient Saturations on Room Air while Ambulating = 87%  Patient Saturations on 3 Liters of oxygen while Ambulating = 92%  Please briefly explain why patient needs home oxygen: to prevent O2 desat.  Peri Maris, MBA, BS, RN

## 2015-06-11 NOTE — Progress Notes (Signed)
Internal Medicine Attending  Date: 06/11/2015  Patient name: Jeremiah Burnett Medical record number: [id number] Date of birth: 07/11/1940 Age: 75 y.o. Gender: male  I saw and evaluated the patient. I reviewed the resident's note by Dr. Allena Katz and I agree with the resident's findings and plans as documented in his progress note.  Please see my H&P dated 06/11/2015 and attached to Dr. Eliane Decree H&P dated 06/10/2015 for the specifics of my evaluation, assessment, and plan from earlier today.

## 2015-06-11 NOTE — Progress Notes (Signed)
Inpatient Diabetes Program Recommendations  AACE/ADA: New Consensus Statement on Inpatient Glycemic Control (2015)  Target Ranges:  Prepandial:   less than 140 mg/dL      Peak postprandial:   less than 180 mg/dL (1-2 hours)      Critically ill patients:  140 - 180 mg/dL   Review of Glycemic Control  Diabetes history: DM 2 Outpatient Diabetes medications: Lantus 15 units QAM, 25 units QPM, Metformin 1,000 mg BID Current orders for Inpatient glycemic control: Lantus 10 units QAM, 17 units QPM, Novolog Moderate + HS scale  Inpatient Diabetes Program Recommendations: Insulin - Basal: Fasting glucose this am was 276 mg/dl. Please increase basal insulin doses to Lantus 15 units QAM and 22 units QPM. Patient takes 15 QAM and 25 units QPM at home.  Thanks,  Christena Deem RN, MSN, [facility] Inpatient Diabetes Coordinator Team Pager [phone] (8a-5p)

## 2015-06-11 NOTE — Progress Notes (Signed)
   Subjective: Patient feels much improved this morning at his baseline. He feels comfortable for discharge with oxygen to use at home. He received a breathing treatment just prior to rounds this morning. Objective: Vital signs in last 24 hours: Filed Vitals:   06/10/15 2029 06/10/15 2215 06/11/15 0442 06/11/15 0805  BP: 97/64  114/69   Pulse: 77  77   Temp: 98.7 F (37.1 C)  97.8 F (36.6 C)   TempSrc:      Resp: 20  21   Height:      Weight: 191 lb (86.637 kg)     SpO2: 99% 96% 98% 93%   Weight change:   Intake/Output Summary (Last 24 hours) at 06/11/15 1137 Last data filed at 06/11/15 0600  Gross per 24 hour  Intake   2530 ml  Output      0 ml  Net   2530 ml   General: resting in bed, no acute distress HEENT: PERRL, EOMI, no scleral icterus Cardiac: distant sounds, RRR, no rubs, murmurs or gallops Pulm: expiratory wheezing bilateral lung fields Abd: soft, nontender, nondistended, BS present Ext: warm and well perfused    Assessment/Plan: Principal Problem:   Acute respiratory failure with hypoxia (HCC) Active Problems:   COPD (chronic obstructive pulmonary disease) (HCC)   HTN (hypertension)   T2DM (type 2 diabetes mellitus) (HCC)   Hyperlipidemia due to type 2 diabetes mellitus (HCC)   GERD (gastroesophageal reflux disease)   Tobacco abuse   Chronic systolic CHF (congestive heart failure) (HCC)   Obesity  Acute hypoxia: Patient with acute respiratory distress in setting of running out of home oxygen. He has improved significantly with Solu-medrol, Albuterol, and Duoneb treatments on admission. Does not appear to be COPD exacerbation or requiring antibiotics/further steroids at this time. -Continue Duoneb, Spiriva, Albuterol, Mucinex -Continue home Oxygen 3L via  -Case management has seen patient and arrange for oxygen -Discharge today  T2DM: On Lantus 15 AM and 25 PM at home along with Metformin 1000 mg BID -Increase Lantus to 15 AM and 22  PM -SSI-M  HTN: BP controlled -Continue home Linsiopril-HCTZ 10-12.g mg daily   Dispo:  Anticipated discharge today with home oxygen.     Darreld Mclean, MD 06/11/2015, 11:37 AM

## 2015-06-12 DIAGNOSIS — J449 Chronic obstructive pulmonary disease, unspecified: Secondary | ICD-10-CM | POA: Diagnosis not present

## 2015-06-12 DIAGNOSIS — J441 Chronic obstructive pulmonary disease with (acute) exacerbation: Secondary | ICD-10-CM | POA: Diagnosis not present

## 2015-06-12 DIAGNOSIS — J9601 Acute respiratory failure with hypoxia: Secondary | ICD-10-CM | POA: Diagnosis not present

## 2015-06-12 DIAGNOSIS — Z9981 Dependence on supplemental oxygen: Secondary | ICD-10-CM | POA: Diagnosis not present

## 2015-06-12 DIAGNOSIS — I10 Essential (primary) hypertension: Secondary | ICD-10-CM | POA: Diagnosis not present

## 2015-06-12 LAB — GLUCOSE, CAPILLARY
GLUCOSE-CAPILLARY: 380 mg/dL — AB (ref 65–99)
GLUCOSE-CAPILLARY: 236 mg/dL — AB (ref 65–99)
Glucose-Capillary: 221 mg/dL — ABNORMAL HIGH (ref 65–99)
Glucose-Capillary: 200 mg/dL — ABNORMAL HIGH (ref 65–99)

## 2015-06-12 MED ORDER — TIOTROPIUM BROMIDE MONOHYDRATE 18 MCG IN CAPS
18.0000 ug | ORAL_CAPSULE | Freq: Every day | RESPIRATORY_TRACT | Status: DC
  Administered 2015-06-12 – 2015-06-13 (×2): 18 ug via RESPIRATORY_TRACT
  Filled 2015-06-12: qty 5

## 2015-06-12 MED ORDER — ALBUTEROL SULFATE (2.5 MG/3ML) 0.083% IN NEBU
3.0000 mL | INHALATION_SOLUTION | RESPIRATORY_TRACT | Status: DC
  Administered 2015-06-12: 3 mL via RESPIRATORY_TRACT
  Filled 2015-06-12: qty 3

## 2015-06-12 MED ORDER — MOMETASONE FURO-FORMOTEROL FUM 100-5 MCG/ACT IN AERO
2.0000 | INHALATION_SPRAY | Freq: Two times a day (BID) | RESPIRATORY_TRACT | Status: DC
  Administered 2015-06-13 (×2): 2 via RESPIRATORY_TRACT
  Filled 2015-06-12 (×2): qty 8.8

## 2015-06-12 MED ORDER — ALBUTEROL SULFATE (2.5 MG/3ML) 0.083% IN NEBU: 3.0000 mL | INHALATION_SOLUTION | RESPIRATORY_TRACT | Status: DC | PRN

## 2015-06-12 MED ORDER — ALBUTEROL SULFATE (2.5 MG/3ML) 0.083% IN NEBU
3.0000 mL | INHALATION_SOLUTION | RESPIRATORY_TRACT | Status: DC
  Administered 2015-06-12 – 2015-06-13 (×7): 3 mL via RESPIRATORY_TRACT
  Filled 2015-06-12 (×8): qty 3

## 2015-06-12 NOTE — Progress Notes (Signed)
   Subjective: Patient feels as if his breathing is near baseline. He does report that he needs his breathing treatments every 4 hours but has not gotten as he is unaware that he could ask for them when needed.  Objective: Vital signs in last 24 hours: Filed Vitals:   06/11/15 2052 06/11/15 2155 06/12/15 0450 06/12/15 0855  BP:  99/68 101/78 106/68  Pulse:  81 89 64  Temp:  98 F (36.7 C) 98.3 F (36.8 C) 98.4 F (36.9 C)  TempSrc:  Oral Oral Oral  Resp:  Height:      Weight:      SpO2: 95% 97% 97% 92%   Weight change:   Intake/Output Summary (Last 24 hours) at 06/12/15 1152 Last data filed at 06/12/15 0856  Gross per 24 hour  Intake    720 ml  Output      0 ml  Net    720 ml   General: resting in bed, no acute distress HEENT: PERRL, EOMI, no scleral icterus Cardiac: distant sounds, RRR, no rubs, murmurs or gallops Pulm: minimal end-expiratory wheezing bilateral lung fields Abd: soft, nontender, nondistended, BS present Ext: warm and well perfused    Assessment/Plan: Principal Problem:   Acute respiratory failure with hypoxia (HCC) Active Problems:   COPD (chronic obstructive pulmonary disease) (HCC)   HTN (hypertension)   T2DM (type 2 diabetes mellitus) (HCC)   Hyperlipidemia due to type 2 diabetes mellitus (HCC)   GERD (gastroesophageal reflux disease)   Tobacco abuse   Chronic systolic CHF (congestive heart failure) (HCC)   Obesity  Acute hypoxia: Patient with acute respiratory distress in setting of running out of home oxygen. He has improved to baseline with current treatments. Patient disposition pending arrangement of home oxygen and housing. -Continue Albuterol q4h, Mucinex BID -Continue home Oxygen 3L via [place] -Add Dulera 2 puffs BID -Case management following patient and working to arrange disposition plans for oxygen delivery, appreciate assistance   T2DM: On Lantus 15 AM and 25 PM at home along with Metformin 1000 mg BID -Lantus 15 AM and  22 PM -SSI-M  HTN: BP controlled -Continue home Linsiopril-HCTZ 10-12.g mg daily   Dispo:  Discharge pending arrangements for housing and home oxygen delivery.     Darreld Mclean, MD 06/12/2015, 11:52 AM

## 2015-06-12 NOTE — Care Management Obs Status (Signed)
MEDICARE OBSERVATION STATUS NOTIFICATION   Patient Details  Name: Jeremiah Burnett MRN: [id number] Date of Birth: [birth year]   Medicare Observation Status Notification Given:  Yes    Lee-Anne Flicker, Annamarie Major, RN 06/12/2015, 1:42 PM

## 2015-06-12 NOTE — Progress Notes (Signed)
Internal Medicine Attending  Date: 06/12/2015  Patient name: Jeremiah Burnett Medical record number: [id number] Date of birth: 02/12/1941 Age: 75 y.o. Gender: male  I saw and evaluated the patient. I reviewed the resident's note by Dr. Allena Katz and I agree with the resident's findings and plans as documented in his progress note.  Mr. Grand remains stable from a breathing standpoint. We will add a long-acting bronchodilator-steroid inhaler combination while an inpatient to see if it improves his symptomatology and exercise tolerance. Apparently work is continuing to be done for housing so that he may be discharged with oxygen therapy.

## 2015-06-13 DIAGNOSIS — J9601 Acute respiratory failure with hypoxia: Secondary | ICD-10-CM | POA: Diagnosis not present

## 2015-06-13 DIAGNOSIS — J441 Chronic obstructive pulmonary disease with (acute) exacerbation: Secondary | ICD-10-CM | POA: Diagnosis not present

## 2015-06-13 LAB — GLUCOSE, CAPILLARY
GLUCOSE-CAPILLARY: 183 mg/dL — AB (ref 65–99)
GLUCOSE-CAPILLARY: 200 mg/dL — AB (ref 65–99)
GLUCOSE-CAPILLARY: 283 mg/dL — AB (ref 65–99)

## 2015-06-13 MED ORDER — ALBUTEROL SULFATE (2.5 MG/3ML) 0.083% IN NEBU: 3.0000 mL | INHALATION_SOLUTION | RESPIRATORY_TRACT | Status: DC

## 2015-06-13 MED ORDER — LISINOPRIL-HYDROCHLOROTHIAZIDE 10-12.5 MG PO TABS: 1.0000 | ORAL_TABLET | Freq: Every day | ORAL | Status: AC

## 2015-06-13 MED ORDER — PANTOPRAZOLE SODIUM 40 MG PO TBEC: 40.0000 mg | DELAYED_RELEASE_TABLET | Freq: Every day | ORAL | Status: AC

## 2015-06-13 MED ORDER — INSULIN GLARGINE 100 UNIT/ML ~~LOC~~ SOLN: SUBCUTANEOUS | Status: AC

## 2015-06-13 MED ORDER — TIOTROPIUM BROMIDE MONOHYDRATE 18 MCG IN CAPS: 18.0000 ug | ORAL_CAPSULE | Freq: Every day | RESPIRATORY_TRACT | Status: AC

## 2015-06-13 MED ORDER — ATORVASTATIN CALCIUM 20 MG PO TABS: 20.0000 mg | ORAL_TABLET | Freq: Every day | ORAL | Status: AC

## 2015-06-13 MED ORDER — MIRTAZAPINE 30 MG PO TABS: 30.0000 mg | ORAL_TABLET | Freq: Every day | ORAL | Status: AC

## 2015-06-13 MED ORDER — METFORMIN HCL 1000 MG PO TABS: 1000.0000 mg | ORAL_TABLET | Freq: Every day | ORAL | Status: AC

## 2015-06-13 NOTE — Progress Notes (Signed)
Pt given discharge instructions and prescriptions. Reviewed with pt and significant other. Pt and significant other both verbalized understanding.

## 2015-06-13 NOTE — Progress Notes (Signed)
Inpatient Diabetes Program Recommendations  AACE/ADA: New Consensus Statement on Inpatient Glycemic Control (2015)  Target Ranges:  Prepandial:   less than 140 mg/dL      Peak postprandial:   less than 180 mg/dL (1-2 hours)      Critically ill patients:  140 - 180 mg/dL   Results for VOLLIE, BRUNTY (MRN [id number]) as of 06/13/2015 08:55  Ref. Range 06/12/2015 06:24 06/12/2015 12:09 06/12/2015 17:13 06/12/2015 20:50 06/13/2015 08:17  Glucose-Capillary Latest Ref Range: 65-99 mg/dL 914 (H) 782 (H) 956 (H) 236 (H) 183 (H)   Review of Glycemic Control  Diabetes history: DM 2 Outpatient Diabetes medications: Lantus 15 units QAM, 25 units QPM, Metformin 1,000 mg BID Current orders for Inpatient glycemic control: Lantus 15 units QAM, 22 units QPM, Novolog Moderate + HS scale  Inpatient Diabetes Program Recommendations:   Insulin - Meal Coverage: Glucose increased to 200-300 range post prandial. Please consider starting Novolog 5 units TID meal coverage in addition to correction scale.   Thanks,  Christena Deem RN, MSN, [facility] Inpatient Diabetes Coordinator Team Pager [phone] (8a-5p)

## 2015-06-13 NOTE — Progress Notes (Signed)
   Subjective: Patient feels well with continued breathing treatments. Still awaiting leads on shelter/place to stay on discharge for home oxygen delivery. Objective: Vital signs in last 24 hours: Filed Vitals:   06/13/15 0518 06/13/15 0816 06/13/15 0845 06/13/15 1111  BP: 97/51  108/91   Pulse: 63  68   Temp:   97.8 F (36.6 C)   TempSrc:   Oral   Resp:   18   Height:      Weight:      SpO2:  98% 98% 96%   Weight change:   Intake/Output Summary (Last 24 hours) at 06/13/15 1147 Last data filed at 06/12/15 1700  Gross per 24 hour  Intake    240 ml  Output      0 ml  Net    240 ml   General: sitting up in bed, no acute distress Cardiac: distant sounds, RRR, no rubs, murmurs or gallops Pulm: mild rhonchi right lower lung field, no wheezing or rales Abd: soft,  nondistended Ext: warm and well perfused    Assessment/Plan: Principal Problem:   Acute respiratory failure with hypoxia (HCC) Active Problems:   COPD (chronic obstructive pulmonary disease) (HCC)   HTN (hypertension)   T2DM (type 2 diabetes mellitus) (HCC)   Hyperlipidemia due to type 2 diabetes mellitus (HCC)   GERD (gastroesophageal reflux disease)   Tobacco abuse   Chronic systolic CHF (congestive heart failure) (HCC)   Obesity  Acute hypoxia: Patient with acute respiratory distress in setting of running out of home oxygen. He has improved to baseline with current treatments. Patient disposition pending arrangement of home oxygen and housing. Added dulera, however had not received yesterday. We will see if this provides improvement and consider adding to his regimen on discharge. -Continue Albuterol q4h, Spiriva qd, Mucinex BID -Continue home Oxygen 3L via [place] -Added Dulera 2 puffs BID -Case management following patient and working to arrange disposition plans for oxygen delivery, appreciate assistance   T2DM: On Lantus 15 AM and 25 PM at home along with Metformin 1000 mg BID -Lantus 15 AM and 22  PM -SSI-M  HTN: BP controlled -Continue home Linsiopril-HCTZ 10-12.g mg daily   Dispo:  Discharge pending arrangements for housing and home oxygen delivery.     Darreld Mclean, MD 06/13/2015, 11:47 AM

## 2015-06-13 NOTE — Progress Notes (Signed)
SATURATION QUALIFICATIONS:   Patient Saturations on Room Air at Rest = 96 Patient Saturations on Room Air while Ambulating = 88 Patient Saturations on 2 Liters of oxygen while Ambulating = 93

## 2015-06-13 NOTE — Progress Notes (Signed)
Internal Medicine Attending  Date: 06/13/2015  Patient name: Jeremiah Burnett Medical record number: [id number] Date of birth: August 06, 1940 Age: 75 y.o. Gender: male  I saw and evaluated the patient. I reviewed the resident's note by Dr. Allena Katz and I agree with the resident's findings and plans as documented in his progress note.  [name] remain stable from a breathing standpoint despite not getting the long-acting bronchodilator-steroid inhaler combination yesterday. We will see that he gets it today. Work is continuing on housing so he may be discharged home on oxygen therapy.

## 2015-06-13 NOTE — Clinical Social Work Note (Addendum)
Patient medically stable for discharge today. Patient and family provided with hotel room [facility]) for 2 nights, along with cab voucher to get to hotel. Patient's fiancee, Russella Dar given 2 single-ride bus tickets to get to her interview at the [employer] on [address] tomorrow. She was also provided with detailed instructions on which buses to catch to get to her 12 noon interview. Mr. Attar explained that the police assisted him in calling for a tow truck for his vehicle, however he knows where his vehicle is and will need approx. $600 to pay for the needed repairs.  Patient expressed appreciation for assistance provided.  Genelle Bal, MSW, LCSW Licensed Clinical Social Worker Clinical Social Work Department [employer] [phone]

## 2015-06-13 NOTE — Progress Notes (Signed)
Lantus 22 units administered before discharge.

## 2015-08-31 ENCOUNTER — Emergency Department (HOSPITAL_COMMUNITY)
Admission: EM | Admit: 2015-08-31 | Discharge: 2015-08-31 | Disposition: A | Discharge status: Home / Self Care | Payer: Medicare Other | Attending: Emergency Medicine | Admitting: Emergency Medicine

## 2015-08-31 ENCOUNTER — Emergency Department (HOSPITAL_COMMUNITY): Payer: Medicare Other

## 2015-08-31 ENCOUNTER — Encounter (HOSPITAL_COMMUNITY): Payer: Self-pay

## 2015-08-31 DIAGNOSIS — Z9889 Other specified postprocedural states: Secondary | ICD-10-CM | POA: Diagnosis not present

## 2015-08-31 DIAGNOSIS — F17210 Nicotine dependence, cigarettes, uncomplicated: Secondary | ICD-10-CM | POA: Insufficient documentation

## 2015-08-31 DIAGNOSIS — I2510 Atherosclerotic heart disease of native coronary artery without angina pectoris: Secondary | ICD-10-CM | POA: Diagnosis not present

## 2015-08-31 DIAGNOSIS — J441 Chronic obstructive pulmonary disease with (acute) exacerbation: Secondary | ICD-10-CM | POA: Insufficient documentation

## 2015-08-31 DIAGNOSIS — Z590 Homelessness: Secondary | ICD-10-CM | POA: Diagnosis not present

## 2015-08-31 DIAGNOSIS — Z794 Long term (current) use of insulin: Secondary | ICD-10-CM | POA: Insufficient documentation

## 2015-08-31 DIAGNOSIS — J209 Acute bronchitis, unspecified: Secondary | ICD-10-CM | POA: Insufficient documentation

## 2015-08-31 DIAGNOSIS — E119 Type 2 diabetes mellitus without complications: Secondary | ICD-10-CM | POA: Diagnosis not present

## 2015-08-31 DIAGNOSIS — Z7984 Long term (current) use of oral hypoglycemic drugs: Secondary | ICD-10-CM | POA: Diagnosis not present

## 2015-08-31 DIAGNOSIS — Z79899 Other long term (current) drug therapy: Secondary | ICD-10-CM | POA: Diagnosis not present

## 2015-08-31 DIAGNOSIS — M1990 Unspecified osteoarthritis, unspecified site: Secondary | ICD-10-CM | POA: Insufficient documentation

## 2015-08-31 DIAGNOSIS — J9801 Acute bronchospasm: Secondary | ICD-10-CM

## 2015-08-31 DIAGNOSIS — R0602 Shortness of breath: Secondary | ICD-10-CM | POA: Diagnosis present

## 2015-08-31 DIAGNOSIS — I10 Essential (primary) hypertension: Secondary | ICD-10-CM | POA: Diagnosis not present

## 2015-08-31 LAB — I-STAT CG4 LACTIC ACID, ED
LACTIC ACID, VENOUS: 1 mmol/L (ref 0.5–2.0)
LACTIC ACID, VENOUS: 0.8 mmol/L (ref 0.5–2.0)

## 2015-08-31 LAB — URINALYSIS, ROUTINE W REFLEX MICROSCOPIC
Bilirubin Urine: NEGATIVE
Glucose, UA: NEGATIVE mg/dL
HGB URINE DIPSTICK: NEGATIVE
Ketones, ur: NEGATIVE mg/dL
Nitrite: NEGATIVE
PROTEIN: NEGATIVE mg/dL
SPECIFIC GRAVITY, URINE: 1.018 (ref 1.005–1.030)
pH: 7 (ref 5.0–8.0)

## 2015-08-31 LAB — BASIC METABOLIC PANEL
ANION GAP: 12 (ref 5–15)
BUN: 14 mg/dL (ref 6–20)
CHLORIDE: 99 mmol/L — AB (ref 101–111)
CO2: 27 mmol/L (ref 22–32)
Calcium: 9.7 mg/dL (ref 8.9–10.3)
Creatinine, Ser: 0.74 mg/dL (ref 0.61–1.24)
GFR calc non Af Amer: >60 mL/min (ref >60)
Glucose, Bld: 112 mg/dL — ABNORMAL HIGH (ref 65–99)
POTASSIUM: 4.2 mmol/L (ref 3.5–5.1)
Sodium: 138 mmol/L (ref 135–145)

## 2015-08-31 LAB — CBC
HEMATOCRIT: 48.7 % (ref 39.0–52.0)
HEMOGLOBIN: 15.7 g/dL (ref 13.0–17.0)
MCH: 27.8 pg (ref 26.0–34.0)
MCHC: 32.2 g/dL (ref 30.0–36.0)
MCV: 86.3 fL (ref 78.0–100.0)
Platelets: 183 10*3/uL (ref 150–400)
RBC: 5.64 MIL/uL (ref 4.22–5.81)
RDW: 13.6 % (ref 11.5–15.5)
WBC: 8.3 10*3/uL (ref 4.0–10.5)

## 2015-08-31 LAB — URINE MICROSCOPIC-ADD ON

## 2015-08-31 MED ORDER — SODIUM CHLORIDE 0.9 % IV BOLUS (SEPSIS)
250.0000 mL | Freq: Once | INTRAVENOUS | Status: AC
  Administered 2015-08-31: 250 mL via INTRAVENOUS

## 2015-08-31 MED ORDER — PREDNISONE 20 MG PO TABS: 20.0000 mg | ORAL_TABLET | Freq: Two times a day (BID) | ORAL | Status: AC

## 2015-08-31 MED ORDER — SODIUM CHLORIDE 0.9 % IV BOLUS (SEPSIS)
1000.0000 mL | Freq: Once | INTRAVENOUS | Status: AC
  Administered 2015-08-31: 1000 mL via INTRAVENOUS

## 2015-08-31 MED ORDER — DEXTROSE 5 % IV SOLN
1.0000 g | Freq: Once | INTRAVENOUS | Status: AC
  Administered 2015-08-31: 1 g via INTRAVENOUS
  Filled 2015-08-31: qty 10

## 2015-08-31 MED ORDER — DOXYCYCLINE HYCLATE 100 MG PO CAPS: 100.0000 mg | ORAL_CAPSULE | Freq: Two times a day (BID) | ORAL | Status: AC

## 2015-08-31 MED ORDER — AEROCHAMBER Z-STAT PLUS/MEDIUM MISC
1.0000 | Freq: Once | Status: AC
  Administered 2015-08-31: 1
  Filled 2015-08-31: qty 1

## 2015-08-31 MED ORDER — ONDANSETRON HCL 4 MG/2ML IJ SOLN
4.0000 mg | Freq: Once | INTRAMUSCULAR | Status: AC
  Administered 2015-08-31: 4 mg via INTRAVENOUS
  Filled 2015-08-31: qty 2

## 2015-08-31 MED ORDER — DEXTROSE 5 % IV SOLN
500.0000 mg | Freq: Once | INTRAVENOUS | Status: AC
  Administered 2015-08-31: 500 mg via INTRAVENOUS
  Filled 2015-08-31: qty 500

## 2015-08-31 MED ORDER — ALBUTEROL SULFATE HFA 108 (90 BASE) MCG/ACT IN AERS
2.0000 | INHALATION_SPRAY | RESPIRATORY_TRACT | Status: DC | PRN
  Administered 2015-08-31: 2 via RESPIRATORY_TRACT
  Filled 2015-08-31: qty 6.7

## 2015-08-31 MED ORDER — ACETAMINOPHEN 325 MG PO TABS
650.0000 mg | ORAL_TABLET | Freq: Once | ORAL | Status: AC
  Administered 2015-08-31: 650 mg via ORAL
  Filled 2015-08-31: qty 2

## 2015-08-31 MED ORDER — ALBUTEROL SULFATE (2.5 MG/3ML) 0.083% IN NEBU
5.0000 mg | INHALATION_SOLUTION | Freq: Once | RESPIRATORY_TRACT | Status: AC
  Administered 2015-08-31: 5 mg via RESPIRATORY_TRACT
  Filled 2015-08-31: qty 6

## 2015-08-31 MED ORDER — ALBUTEROL SULFATE (2.5 MG/3ML) 0.083% IN NEBU: 2.5000 mg | INHALATION_SOLUTION | RESPIRATORY_TRACT | Status: AC | PRN

## 2015-08-31 MED ORDER — METHYLPREDNISOLONE SODIUM SUCC 125 MG IJ SOLR
125.0000 mg | Freq: Once | INTRAMUSCULAR | Status: AC
  Administered 2015-08-31: 125 mg via INTRAVENOUS
  Filled 2015-08-31: qty 2

## 2015-08-31 NOTE — Discharge Planning (Signed)
Jeremiah Cohnamellia Toia Micale, RN, BSN, [place] [phone]. Pt qualifies for DME nebulizer.  DME  ordered through [facility].  [facility]  [facility] prior to D/C home.

## 2015-08-31 NOTE — Discharge Instructions (Signed)
Stop smoking cigarettes. Use the inhaler, or your nebulizer, as needed for trouble breathing. Follow-up with a primary care doctor as soon as possible.    Bronchospasm, Adult A bronchospasm is a spasm or tightening of the airways going into the lungs. During a bronchospasm breathing becomes more difficult because the airways get smaller. When this happens there can be coughing, a whistling sound when breathing (wheezing), and difficulty breathing. Bronchospasm is often associated with asthma, but not all patients who experience a bronchospasm have asthma. CAUSES  A bronchospasm is caused by inflammation or irritation of the airways. The inflammation or irritation may be triggered by:  1. Allergies (such as to animals, pollen, food, or mold). Allergens that cause bronchospasm may cause wheezing immediately after exposure or many hours later.  2. Infection. Viral infections are believed to be the most common cause of bronchospasm.  3. Exercise.  4. Irritants (such as pollution, cigarette smoke, strong odors, aerosol sprays, and paint fumes).  5. Weather changes. Winds increase molds and pollens in the air. Rain refreshes the air by washing irritants out. Cold air may cause inflammation.  6. Stress and emotional upset.  SIGNS AND SYMPTOMS   Wheezing.   Excessive nighttime coughing.   Frequent or severe coughing with a simple cold.   Chest tightness.   Shortness of breath.  DIAGNOSIS  Bronchospasm is usually diagnosed through a history and physical exam. Tests, such as chest X-rays, are sometimes done to look for other conditions. TREATMENT   Inhaled medicines can be given to open up your airways and help you breathe. The medicines can be given using either an inhaler or a nebulizer machine.  Corticosteroid medicines may be given for severe bronchospasm, usually when it is associated with asthma. HOME CARE INSTRUCTIONS   Always have a plan prepared for seeking medical care.  Know when to call your health care provider and local emergency services (911 in the [place] Know where you can access local emergency care.  Only take medicines as directed by your health care provider.  If you were prescribed an inhaler or nebulizer machine, ask your health care provider to explain how to use it correctly. Always use a spacer with your inhaler if you were given one.  It is necessary to remain calm during an attack. Try to relax and breathe more slowly.  Control your home environment in the following ways:   Change your heating and air conditioning filter at least once a month.   Limit your use of fireplaces and wood stoves.  Do not smoke and do not allow smoking in your home.   Avoid exposure to perfumes and fragrances.   Get rid of pests (such as roaches and mice) and their droppings.   Throw away plants if you see mold on them.   Keep your house clean and dust free.   Replace carpet with wood, tile, or vinyl flooring. Carpet can trap dander and dust.   Use allergy-proof pillows, mattress covers, and box spring covers.   Wash bed sheets and blankets every week in hot water and dry them in a dryer.   Use blankets that are made of polyester or cotton.   Wash hands frequently. SEEK MEDICAL CARE IF:   You have muscle aches.   You have chest pain.   The sputum changes from clear or white to yellow, green, gray, or bloody.   The sputum you cough up gets thicker.   There are problems that may be related to the  medicine you are given, such as a rash, itching, swelling, or trouble breathing.  SEEK IMMEDIATE MEDICAL CARE IF:   You have worsening wheezing and coughing even after taking your prescribed medicines.   You have increased difficulty breathing.   You develop severe chest pain. MAKE SURE YOU:   Understand these instructions.  Will watch your condition.  Will get help right away if you are not doing well or get worse.     This information is not intended to replace advice given to you by your health care provider. Make sure you discuss any questions you have with your health care provider.   Document Released: 04/03/2003 Document Revised: 04/21/2014 Document Reviewed: 09/20/2012 Elsevier Interactive Patient Education 2016 [employer].  How to Use an Inhaler Using your inhaler correctly is very important. Good technique will make sure that the medicine reaches your lungs.  HOW TO USE AN INHALER: 7. Take the cap off the inhaler. 8. If this is the first time using your inhaler, you need to prime it. Shake the inhaler for 5 seconds. Release four puffs into the air, away from your face. Ask your doctor for help if you have questions. 9. Shake the inhaler for 5 seconds. 10. Turn the inhaler so the bottle is above the mouthpiece. 11. Put your pointer finger on top of the bottle. Your thumb holds the bottom of the inhaler. 12. Open your mouth. 13. Either hold the inhaler away from your mouth (the width of 2 fingers) or place your lips tightly around the mouthpiece. Ask your doctor which way to use your inhaler. 14. Breathe out as much air as possible. 15. Breathe in and push down on the bottle 1 time to release the medicine. You will feel the medicine go in your mouth and throat. 16. Continue to take a deep breath in very slowly. Try to fill your lungs. 17. After you have breathed in completely, hold your breath for 10 seconds. This will help the medicine to settle in your lungs. If you cannot hold your breath for 10 seconds, hold it for as long as you can before you breathe out. 18. Breathe out slowly, through pursed lips. Whistling is an example of pursed lips. 19. If your doctor has told you to take more than 1 puff, wait at least 15-30 seconds between puffs. This will help you get the best results from your medicine. Do not use the inhaler more than your doctor tells you to. 20. Put the cap back on the  inhaler. 21. Follow the directions from your doctor or from the inhaler package about cleaning the inhaler. If you use more than one inhaler, ask your doctor which inhalers to use and what order to use them in. Ask your doctor to help you figure out when you will need to refill your inhaler.  If you use a steroid inhaler, always rinse your mouth with water after your last puff, gargle and spit out the water. Do not swallow the water. GET HELP IF:  The inhaler medicine only partially helps to stop wheezing or shortness of breath.  You are having trouble using your inhaler.  You have some increase in thick spit (phlegm). GET HELP RIGHT AWAY IF:  The inhaler medicine does not help your wheezing or shortness of breath or you have tightness in your chest.  You have dizziness, headaches, or fast heart rate.  You have chills, fever, or night sweats.  You have a large increase of thick spit, or  your thick spit is bloody. MAKE SURE YOU:   Understand these instructions.  Will watch your condition.  Will get help right away if you are not doing well or get worse.   This information is not intended to replace advice given to you by your health care provider. Make sure you discuss any questions you have with your health care provider.   Document Released: 01/08/2008 Document Revised: 01/19/2013 Document Reviewed: 10/28/2012 Elsevier Interactive Patient Education [employer]

## 2015-08-31 NOTE — ED Provider Notes (Signed)
CSN: [id number]     Arrival date & time 08/31/15  0906 History   First MD Initiated Contact with Patient 08/31/15 0935     Chief Complaint  Patient presents with  . Shortness of Breath     (Consider location/radiation/quality/duration/timing/severity/associated sxs/prior Treatment) HPI   Jeremiah Burnett is a 75 y.o. male who presents for evaluation of nausea, headache, dizziness with standing, persistent cough and chills and sweats for many months. He states that he is homeless, because he has run out of money to stay in a hotel. He moved to [place] about one month ago from [place], [place]. He does not have a local PCP. He does not have a nebulizer at this time. He continues to use oxygen and smokes cigarettes. His cough is chronic and associated with tobacco abuse. He denies back pain, focal weakness or paresthesia. He has a headache today. He is been arguing with his girlfriend, with whom he is currently staying. He states he  does not use alcohol or illegal drugs.     Past Medical History  Diagnosis Date  . Diabetes mellitus   . Hypertension   . COPD (chronic obstructive pulmonary disease) (HCC)   . Coronary artery disease     mild cad per MD history  . Chest pain   . Shortness of breath   . Arthritis    Past Surgical History  Procedure Laterality Date  . Mandible surgery    . Left heart catheterization with coronary angiogram N/A 09/02/2011    Procedure: LEFT HEART CATHETERIZATION WITH CORONARY ANGIOGRAM;  Surgeon: Robynn Pane, MD;  Location: [facility] CATH LAB;  Service: Cardiovascular;  Laterality: N/A;   Family History  Problem Relation Age of Onset  . Heart failure Mother   . Heart failure Father   . Heart failure Sister   . Other Brother    Social History  Substance Use Topics  . Smoking status: Current Every Day Smoker -- 0.25 packs/day for 60 years    Types: Cigarettes  . Smokeless tobacco: Never Used  . Alcohol Use: Yes     Comment: history of ETOH  in  past--denies current use-none  in 17 yrs    Review of Systems  All other systems reviewed and are negative.     Allergies  Review of patient's allergies indicates no known allergies.  Home Medications   Prior to Admission medications   Medication Sig Start Date End Date Taking? Authorizing Provider  atorvastatin (LIPITOR) 20 MG tablet Take 1 tablet (20 mg total) by mouth daily at 6 PM. 06/13/15  Yes Darrick Huntsman, MD  insulin glargine (LANTUS) 100 UNIT/ML injection Inject 15 units every morning and 25 units every evening before bed. 06/13/15  Yes Darrick Huntsman, MD  lisinopril-hydrochlorothiazide (PRINZIDE,ZESTORETIC) 10-12.5 MG tablet Take 1 tablet by mouth daily. 06/13/15  Yes Darrick Huntsman, MD  metFORMIN (GLUCOPHAGE) 1000 MG tablet Take 1 tablet (1,000 mg total) by mouth daily with breakfast. 06/13/15  Yes Darrick Huntsman, MD  mirtazapine (REMERON) 30 MG tablet Take 1 tablet (30 mg total) by mouth at bedtime. 06/13/15  Yes Darrick Huntsman, MD  omeprazole (PRILOSEC) 20 MG capsule Take 20 mg by mouth every evening.   Yes Historical Provider, MD  pantoprazole (PROTONIX) 40 MG tablet Take 1 tablet (40 mg total) by mouth daily at 12 noon. 06/13/15  Yes Darrick Huntsman, MD  tiotropium (SPIRIVA) 18 MCG inhalation capsule Place 18 mcg into inhaler and inhale daily.  Yes Historical Provider, MD  tiotropium (SPIRIVA) 18 MCG inhalation capsule Place 1 capsule (18 mcg total) into inhaler and inhale daily. 06/13/15  Yes Darrick HuntsmanWilliam R Kennedy, MD  albuterol (PROVENTIL) (2.5 MG/3ML) 0.083% nebulizer solution Take 3 mLs (2.5 mg total) by nebulization every 4 (four) hours as needed for wheezing or shortness of breath. 08/31/15   Mancel BaleElliott Ciela Mahajan, MD  atorvastatin (LIPITOR) 20 MG tablet Take 20 mg by mouth daily.    Historical Provider, MD  doxycycline (VIBRAMYCIN) 100 MG capsule Take 1 capsule (100 mg total) by mouth 2 (two) times daily. 08/31/15   Mancel BaleElliott Shirel Mallis, MD  predniSONE (DELTASONE) 20 MG tablet  Take 1 tablet (20 mg total) by mouth 2 (two) times daily. 08/31/15   Mancel BaleElliott Dontea Corlew, MD   BP 109/83 mmHg  Pulse 87  Temp(Src) 97.9 F (36.6 C) (Oral)  Resp 25  Ht 5\' 8"  (1.727 m)  Wt 165 lb (74.844 kg)  BMI 25.09 kg/m2  SpO2 97% Physical Exam  Constitutional: He is oriented to person, place, and time. He appears well-developed and well-nourished.  HENT:  Head: Normocephalic and atraumatic.  Right Ear: External ear normal.  Left Ear: External ear normal.  Eyes: Conjunctivae and EOM are normal. Pupils are equal, round, and reactive to light.  Neck: Normal range of motion and phonation normal. Neck supple.  Cardiovascular: Normal rate, regular rhythm and normal heart sounds.   Pulmonary/Chest: Effort normal and breath sounds normal. He exhibits no bony tenderness.  Abdominal: Soft. There is no tenderness.  Musculoskeletal: Normal range of motion.  Neurological: He is alert and oriented to person, place, and time. No cranial nerve deficit or sensory deficit. He exhibits normal muscle tone. Coordination normal.  Skin: Skin is warm, dry and intact.  Psychiatric: He has a normal mood and affect. His behavior is normal. Judgment and thought content normal.  Nursing note and vitals reviewed.   ED Course  Procedures (including critical care time)  Medications  albuterol (PROVENTIL) (2.5 MG/3ML) 0.083% nebulizer solution 5 mg (5 mg Nebulization Given 08/31/15 0918)  ondansetron (ZOFRAN) injection 4 mg (4 mg Intravenous Given 08/31/15 1024)  sodium chloride 0.9 % bolus 1,000 mL (0 mLs Intravenous Stopped 08/31/15 1340)    And  sodium chloride 0.9 % bolus 1,000 mL (0 mLs Intravenous Stopped 08/31/15 1231)    And  sodium chloride 0.9 % bolus 250 mL (0 mLs Intravenous Stopped 08/31/15 1245)  cefTRIAXone (ROCEPHIN) 1 g in dextrose 5 % 50 mL IVPB (0 g Intravenous Stopped 08/31/15 1103)  azithromycin (ZITHROMAX) 500 mg in dextrose 5 % 250 mL IVPB (0 mg Intravenous Stopped 08/31/15 1232)   methylPREDNISolone sodium succinate (SOLU-MEDROL) 125 mg/2 mL injection 125 mg (125 mg Intravenous Given 08/31/15 1024)  aerochamber Z-Stat Plus/medium 1 each (1 each Other Given 08/31/15 1026)  acetaminophen (TYLENOL) tablet 650 mg (650 mg Oral Given 08/31/15 1153)    Patient Vitals for the past 24 hrs:  BP Temp Temp src Pulse Resp SpO2 Height Weight  08/31/15 1549 109/83 mmHg 97.9 F (36.6 C) Oral 87 25 97 % - -  08/31/15 1430 110/76 mmHg - - 80 (!) 27 97 % - -  08/31/15 1400 103/80 mmHg - - 75 19 97 % - -  08/31/15 1345 113/87 mmHg - - 85 23 97 % - -  08/31/15 1330 128/95 mmHg - - 75 24 99 % - -  08/31/15 1315 132/79 mmHg - - 72 19 99 % - -  08/31/15 1245 109/80 mmHg - -  70 21 99 % - -  08/31/15 1230 115/90 mmHg - - 86 (!) 27 99 % - -  08/31/15 1200 114/82 mmHg - - 79 23 99 % - -  08/31/15 1145 117/89 mmHg - - 78 20 100 % - -  08/31/15 1130 124/88 mmHg - - 69 20 98 % - -  08/31/15 1100 104/70 mmHg - - 66 20 99 % - -  08/31/15 1030 103/66 mmHg - - 70 21 97 % - -  08/31/15 1000 108/73 mmHg - - 74 17 99 % - -  08/31/15 0945 108/84 mmHg - - 82 20 96 % - -  08/31/15 0916 107/72 mmHg 98.2 F (36.8 C) Oral 91 19 98 % 5\' 8"  (1.727 m) 165 lb (74.844 kg)  08/31/15 0909 - - - - - 96 % - -    1:07 PM Reevaluation with update and discussion. After initial assessment and treatment, an updated evaluation reveals No change in clinical status. Mancel Bale L     Labs Review Labs Reviewed  BASIC METABOLIC PANEL - Abnormal; Notable for the following:    Chloride 99 (*)    Glucose, Bld 112 (*)    All other components within normal limits  URINALYSIS, ROUTINE W REFLEX MICROSCOPIC (NOT AT [facility]) - Abnormal; Notable for the following:    Leukocytes, UA SMALL (*)    All other components within normal limits  URINE MICROSCOPIC-ADD ON - Abnormal; Notable for the following:    Squamous Epithelial / LPF 0-5 (*)    Bacteria, UA RARE (*)    All other components within normal limits  CULTURE, BLOOD  (ROUTINE X 2)  CULTURE, BLOOD (ROUTINE X 2)  URINE CULTURE  CULTURE, EXPECTORATED SPUTUM-ASSESSMENT  CBC  I-STAT CG4 LACTIC ACID, ED  I-STAT CG4 LACTIC ACID, ED    Imaging Review Dg Chest Port 1 View  08/31/2015  CLINICAL DATA:  Chronic shortness of breath and cough. Smoker. Initial encounter. EXAM: PORTABLE CHEST 1 VIEW COMPARISON:  PA and lateral chest 06/10/2015.  CT chest 11/24/2011. FINDINGS: The lungs are emphysematous but clear. Heart size is normal. No pneumothorax or pleural effusion. No focal bony abnormality. IMPRESSION: Emphysema without acute disease. Electronically Signed   By: Drusilla Kanner M.D.   On: 08/31/2015 10:19   I have personally reviewed and evaluated these images and lab results as part of my medical decision-making.   EKG Interpretation   Date/Time:  Friday Aug 31 2015 09:13:16 EDT Ventricular Rate:  90 PR Interval:  143 QRS Duration: 68 QT Interval:  328 QTC Calculation: 401 R Axis:   84 Text Interpretation:  Sinus rhythm Ventricular premature complex  Borderline right axis deviation Probable anteroseptal infarct, old since  last tracing no significant change Confirmed by [facility]  MD, Lailynn Southgate ([id number])  on 08/31/2015 9:42:11 AM      MDM   Final diagnoses:  Bronchospasm  Acute bronchitis, unspecified organism    SOB and cough d/t acute bronchitis in a smoker. Patient is homeless. Suspect COPD exacerbation. Able to obtain a nebulizer for patient and d/c with it. Doubt PNE, or metabolic instability.   Nursing Notes Reviewed/ Care Coordinated Applicable Imaging Reviewed Interpretation of Laboratory Data incorporated into ED treatment  The patient appears reasonably screened and/or stabilized for discharge and I doubt any other medical condition or other [facility] requiring further screening, evaluation, or treatment in the ED at this time prior to discharge.  Plan: Home  Medications- Prednisone, Albuterol, Doxycycline; Home Treatments- stop smoking;  return here if the recommended treatment, does not improve the symptoms; Recommended follow up- PCP 1 week     Mancel Bale, MD 08/31/15 2049

## 2015-08-31 NOTE — ED Notes (Signed)
Pt brought in EMS for HA x2 days and SHOB that began this morning around 0500.  Pt has hx of COPD and is on continuous O2 at 3L.  Pt reports productive cough that has been present x3-4 months.  Pt denies chest pain at this time.

## 2015-08-31 NOTE — Clinical Social Work Note (Addendum)
Clinical Social Work Assessment  Patient Details  Name: Jeremiah RileyHoward E Giuliani MRN: [id number] Date of Birth: 05/15/1940  Date of referral:  08/31/15               Reason for consult:  Housing Concerns/Homelessness, Discharge Planning                Permission sought to share information with:  Case Manager Permission granted to share information::  Yes, Verbal Permission Granted  Name::        Agency::     Relationship::     Contact Information:     Housing/Transportation Living arrangements for the past 2 months:  Hotel/Motel Source of Information:  Patient Patient Interpreter Needed:  None Criminal Activity/Legal Involvement Pertinent to Current Situation/Hospitalization:  No - Comment as needed Significant Relationships:  Significant Other Lives with:  Significant Other Do you feel safe going back to the place where you live?  Yes Need for family participation in patient care:  No (Coment)  Care giving concerns:  Patient will need home O2 as well as a nebulizer (which will require electricity) upon discharge and is currently homeless.    Social Worker assessment / plan:  Patient is a 75 YO Caucasian male who presents to the ED with shortness of breath. Patient has a hx of COPD and is on continuous 02 at 3L. CSW engaged with Patient at his bedside along with ED RN Case Manager. CSW introduced self, role of CSW, and inquired about homelessness. Patient reports that [facility] but reports that he has run out of money for the month and is unable to pay the rent. He reports that he does not get his SSI check until the 1st of the month. Patient reports that his girlfriend has been staying with him. Patient reports that he is willing to pitch a tent and stay outside but CSW and Case Manager explained that with the medical equipment that he needs, he will need electricity. [place] to get away from his estranged family.  Patient reports no supports aside from his girlfriend. Patient reports that his girlfriend informed him that the shelters are full. CSW contacted CSW AD as previous CSW had provided Patient with hotel room [employer] for two night along with a cab voucher to get to the hotel. CSW was informed by CSW AD that we could not provide him with another hotel room. CSW AD suggested that CSW meet with Patient's girlfriend upon her arrival to further discuss their resources. CSW will also provide Patient with resources (local area shelters) to contact regarding possible temporary housing. Per chart review, [facility]. CSW will continue to follow.   1127AM: [facility]                 [facility] not do [facility] admits- must complete application process                 [facility]- currently full: will not know if they have any available beds until tonight pending someone does not sign in  Employment status:    Insurance information:  Medicare PT Recommendations:  Not assessed at this time Information / Referral to community resources:  Shelter  Patient/Family's Response to care:  Patient appreciative of CSW's visit and any resources CSW is able to  provide.   Patient/Family's Understanding of and Emotional Response to Diagnosis, Current Treatment, and Prognosis:  Patient verbalizes understanding of diagnosis, current treatment, and prognosis.   Emotional Assessment Appearance:  Appears stated age Attitude/Demeanor/Rapport:   (Cooperative; Engaging) Affect (typically observed):  Accepting, Calm, Appropriate Orientation:  Oriented to Self, Oriented to Place, Oriented to  Time, Oriented to Situation Alcohol / Substance use:  Not Applicable Psych involvement (Current and /or in the community):  No (Comment)  Discharge Needs  Concerns to be addressed:  Discharge Planning Concerns,  Homelessness Readmission within the last 30 days:  No Current discharge risk:  Homeless, Lack of support system Barriers to Discharge:  Homeless with medical needs   Rockwell Germany, LCSW 08/31/2015, 10:56 AM

## 2015-09-01 ENCOUNTER — Telehealth (HOSPITAL_COMMUNITY): Payer: Self-pay

## 2015-09-01 LAB — BLOOD CULTURE ID PANEL (REFLEXED)
Acinetobacter baumannii: NOT DETECTED
CANDIDA GLABRATA: NOT DETECTED
CANDIDA KRUSEI: NOT DETECTED
CANDIDA PARAPSILOSIS: NOT DETECTED
CARBAPENEM RESISTANCE: NOT DETECTED
Candida albicans: NOT DETECTED
Candida tropicalis: NOT DETECTED
ENTEROBACTERIACEAE SPECIES: NOT DETECTED
ESCHERICHIA COLI: NOT DETECTED
Enterobacter cloacae complex: NOT DETECTED
Enterococcus species: NOT DETECTED
Haemophilus influenzae: NOT DETECTED
KLEBSIELLA OXYTOCA: NOT DETECTED
KLEBSIELLA PNEUMONIAE: NOT DETECTED
Listeria monocytogenes: NOT DETECTED
Methicillin resistance: NOT DETECTED
Neisseria meningitidis: NOT DETECTED
PSEUDOMONAS AERUGINOSA: NOT DETECTED
Proteus species: NOT DETECTED
SERRATIA MARCESCENS: NOT DETECTED
STAPHYLOCOCCUS SPECIES: DETECTED — AB
STREPTOCOCCUS AGALACTIAE: NOT DETECTED
STREPTOCOCCUS SPECIES: NOT DETECTED
Staphylococcus aureus (BCID): NOT DETECTED
Streptococcus pneumoniae: NOT DETECTED
Streptococcus pyogenes: NOT DETECTED
Vancomycin resistance: NOT DETECTED

## 2015-09-01 LAB — URINE CULTURE: Special Requests: NORMAL

## 2015-09-01 LAB — I-STAT TROPONIN, ED: TROPONIN I, POC: 0.01 ng/mL (ref 0.00–0.08)

## 2015-09-01 NOTE — Telephone Encounter (Signed)
Pt with 1 set of blood cx (Lt antecubital) growing gram (+) cocci in clusters (staph species) 2nd set bld cx from Rt antecubital w/no growth.  Chart reviewed by B. Laveda Normanran PA "Please contact pt to see if he's feeling better.  If pt report fever or worsening of symptoms then return to the ER for further care"   No answer, rang over to fax machine.  Will try to reach pt in am.

## 2015-09-02 ENCOUNTER — Telehealth (HOSPITAL_BASED_OUTPATIENT_CLINIC_OR_DEPARTMENT_OTHER): Payer: Self-pay

## 2015-09-02 NOTE — Telephone Encounter (Signed)
Post ED Visit - Positive Culture Follow-up: Unsuccessful Patient Follow-up  Culture assessed and recommendations reviewed by: []  Enzo BiNathan Batchelder, Pharm.D. []  Celedonio MiyamotoJeremy Frens, Pharm.D., BCPS []  Garvin FilaMike Maccia, Pharm.D. []  Georgina PillionElizabeth Martin, Pharm.D., BCPS []  AlortonMinh Pham, Vermont[place]arm.D., BCPS, AAHIVP []  Estella HuskMichelle Turner, Pharm.D., BCPS, AAHIVP []  Tennis Mustassie Stewart, Pharm.D. []  Sherle Poeob Vincent, [address]Pharm[address]  Positive blood culture  []  Patient discharged without antimicrobial prescription and treatment is now indicated []  Organism is resistant to prescribed ED discharge antimicrobial [x]  Patient with positive blood cultures  instructed to contact to see fi he's feeling better. If fever or sx then return to ED. No answer or voice mail   Unable to contact patient after 3 attempts, letter will be sent to address on file  Jerry CarasCullom, Infantof Villagomez Burnett 09/02/2015, 9:00 AM

## 2015-09-03 LAB — CULTURE, BLOOD (ROUTINE X 2)

## 2015-09-05 LAB — CULTURE, BLOOD (ROUTINE X 2): Culture: NO GROWTH

## 2015-09-15 ENCOUNTER — Other Ambulatory Visit: Payer: Self-pay | Admitting: Internal Medicine

## 2015-10-30 ENCOUNTER — Telehealth: Payer: Self-pay | Admitting: *Deleted

## 2015-10-30 NOTE — Telephone Encounter (Signed)
(+)  blood culture, no response to phone or letter, unable to notify of (+)results

## 2017-09-12 DEATH — deceased
